# Patient Record
Sex: Female | Born: 1955 | Race: White | Hispanic: No | Marital: Married | Smoking: Never smoker
Health system: Southern US, Community
[De-identification: ages and names within clinical notes are randomized; demographics above are authoritative.]

## PROBLEM LIST (undated history)

## (undated) DIAGNOSIS — R51 Headache: Secondary | ICD-10-CM

## (undated) DIAGNOSIS — M4302 Spondylolysis, cervical region: Secondary | ICD-10-CM

## (undated) DIAGNOSIS — Z860100 Personal history of colon polyps, unspecified: Secondary | ICD-10-CM

## (undated) DIAGNOSIS — E669 Obesity, unspecified: Secondary | ICD-10-CM

## (undated) DIAGNOSIS — G8929 Other chronic pain: Secondary | ICD-10-CM

## (undated) DIAGNOSIS — C44320 Squamous cell carcinoma of skin of unspecified parts of face: Secondary | ICD-10-CM

## (undated) DIAGNOSIS — K589 Irritable bowel syndrome without diarrhea: Secondary | ICD-10-CM

## (undated) DIAGNOSIS — M5412 Radiculopathy, cervical region: Secondary | ICD-10-CM

## (undated) DIAGNOSIS — E785 Hyperlipidemia, unspecified: Secondary | ICD-10-CM

## (undated) DIAGNOSIS — R519 Headache, unspecified: Secondary | ICD-10-CM

## (undated) DIAGNOSIS — N951 Menopausal and female climacteric states: Secondary | ICD-10-CM

## (undated) DIAGNOSIS — T7840XA Allergy, unspecified, initial encounter: Secondary | ICD-10-CM

## (undated) DIAGNOSIS — K219 Gastro-esophageal reflux disease without esophagitis: Secondary | ICD-10-CM

## (undated) HISTORY — DX: Squamous cell carcinoma of skin of unspecified parts of face: C44.320

## (undated) HISTORY — PX: ABLATION: SHX5711

## (undated) HISTORY — DX: Gastro-esophageal reflux disease without esophagitis: K21.9

## (undated) HISTORY — DX: Personal history of colon polyps, unspecified: Z86.0100

## (undated) HISTORY — DX: Hyperlipidemia, unspecified: E78.5

## (undated) HISTORY — DX: Obesity, unspecified: E66.9

## (undated) HISTORY — DX: Spondylolysis, cervical region: M43.02

## (undated) HISTORY — DX: Radiculopathy, cervical region: M54.12

## (undated) HISTORY — DX: Menopausal and female climacteric states: N95.1

## (undated) HISTORY — PX: ROTATOR CUFF REPAIR: SHX139

## (undated) HISTORY — DX: Headache: R51

## (undated) HISTORY — DX: Irritable bowel syndrome, unspecified: K58.9

## (undated) HISTORY — DX: Headache, unspecified: R51.9

## (undated) HISTORY — PX: BREAST LUMPECTOMY: SHX2

## (undated) HISTORY — DX: Personal history of colonic polyps: Z86.010

## (undated) HISTORY — DX: Other chronic pain: G89.29

## (undated) HISTORY — PX: LEG SURGERY: SHX1003

## (undated) HISTORY — DX: Allergy, unspecified, initial encounter: T78.40XA

---

## 2000-03-23 ENCOUNTER — Ambulatory Visit (HOSPITAL_COMMUNITY): Admission: RE | Admit: 2000-03-23 | Discharge: 2000-03-23 | Payer: Self-pay | Admitting: Obstetrics and Gynecology

## 2000-03-23 ENCOUNTER — Encounter: Payer: Self-pay | Admitting: Obstetrics and Gynecology

## 2000-04-05 ENCOUNTER — Encounter: Payer: Self-pay | Admitting: Obstetrics and Gynecology

## 2000-04-05 ENCOUNTER — Encounter: Admission: RE | Admit: 2000-04-05 | Discharge: 2000-04-05 | Payer: Self-pay | Admitting: Obstetrics and Gynecology

## 2004-11-02 ENCOUNTER — Ambulatory Visit (HOSPITAL_BASED_OUTPATIENT_CLINIC_OR_DEPARTMENT_OTHER): Admission: RE | Admit: 2004-11-02 | Discharge: 2004-11-02 | Payer: Self-pay | Admitting: Orthopaedic Surgery

## 2006-08-28 ENCOUNTER — Encounter: Admission: RE | Admit: 2006-08-28 | Discharge: 2006-08-28 | Payer: Self-pay | Admitting: Orthopaedic Surgery

## 2006-09-14 ENCOUNTER — Encounter: Admission: RE | Admit: 2006-09-14 | Discharge: 2006-09-14 | Payer: Self-pay | Admitting: Orthopaedic Surgery

## 2011-11-05 ENCOUNTER — Emergency Department (INDEPENDENT_AMBULATORY_CARE_PROVIDER_SITE_OTHER)
Admission: EM | Admit: 2011-11-05 | Discharge: 2011-11-05 | Disposition: A | Discharge status: Home / Self Care | Payer: BC Managed Care – PPO | Source: Home / Self Care | Attending: Emergency Medicine | Admitting: Emergency Medicine

## 2011-11-05 DIAGNOSIS — R059 Cough, unspecified: Secondary | ICD-10-CM

## 2011-11-05 DIAGNOSIS — R05 Cough: Secondary | ICD-10-CM

## 2011-11-05 DIAGNOSIS — J069 Acute upper respiratory infection, unspecified: Secondary | ICD-10-CM

## 2011-11-05 MED ORDER — GUAIFENESIN-CODEINE 100-10 MG/5ML PO SYRP: 5.0000 mL | ORAL_SOLUTION | Freq: Four times a day (QID) | ORAL | Status: AC | PRN

## 2011-11-05 MED ORDER — AZITHROMYCIN 250 MG PO TABS: ORAL_TABLET | ORAL | Status: AC

## 2011-11-05 NOTE — ED Notes (Signed)
Pt c/o nasal congestion and non productive cough for past week.  Pt taking ibuprofen and Robitussin for relief.

## 2011-11-05 NOTE — ED Provider Notes (Signed)
History     CSN: [id number]  Arrival date & time 11/05/11  1152   First MD Initiated Contact with Patient 11/05/11 1152      No chief complaint on file.   (Consider location/radiation/quality/duration/timing/severity/associated sxs/prior treatment) HPI Eliana is a 56 y.o. female who complains of onset of cold symptoms for 7 days. Worsening.  Husband sick for 3 weeks. No sore throat + cough No pleuritic pain No wheezing + nasal congestion + post-nasal drainage + sinus pain/pressure + chest congestion No itchy/red eyes No earache No hemoptysis + SOB No chills/sweats No fever No nausea No vomiting No abdominal pain No diarrhea No skin rashes No fatigue No myalgias + headache    No past medical history on file.  No past surgical history on file.  No family history on file.  History  Substance Use Topics  . Smoking status: Not on file  . Smokeless tobacco: Not on file  . Alcohol Use: Not on file    OB History    No data available      Review of Systems  All other systems reviewed and are negative.    Allergies  Review of patient's allergies indicates not on file.  Home Medications  No current outpatient prescriptions on file.  There were no vitals taken for this visit.  Physical Exam  Nursing note and vitals reviewed. Constitutional: She is oriented to person, place, and time. She appears well-developed and well-nourished.  HENT:  Head: Normocephalic and atraumatic.  Right Ear: Tympanic membrane, external ear and ear canal normal.  Left Ear: Tympanic membrane, external ear and ear canal normal.  Nose: Mucosal edema and rhinorrhea present.  Mouth/Throat: Posterior oropharyngeal erythema present. No oropharyngeal exudate or posterior oropharyngeal edema.  Eyes: No scleral icterus.  Neck: Neck supple.  Cardiovascular: Regular rhythm and normal heart sounds.   Pulmonary/Chest: Effort normal and breath sounds normal. No respiratory distress.    Neurological: She is alert and oriented to person, place, and time.  Skin: Skin is warm and dry.  Psychiatric: She has a normal mood and affect. Her speech is normal.    ED Course  Procedures (including critical care time)  Labs Reviewed - No data to display No results found.   1. Acute upper respiratory infections of unspecified site   2. Cough       MDM  1)  Take the prescribed antibiotic as instructed. 2)  Use nasal saline solution (over the counter) at least 3 times a day. 3)  Use over the counter decongestants like Zyrtec-D every 12 hours as needed to help with congestion.  If you have hypertension, do not take medicines with sudafed.  4)  Can take tylenol every 6 hours or motrin every 8 hours for pain or fever. 5)  Follow up with your primary doctor if no improvement in 5-7 days, sooner if increasing pain, fever, or new symptoms.     Lily Kocher, MD 11/05/11 1224

## 2012-06-19 ENCOUNTER — Emergency Department (INDEPENDENT_AMBULATORY_CARE_PROVIDER_SITE_OTHER)
Admission: EM | Admit: 2012-06-19 | Discharge: 2012-06-19 | Disposition: A | Discharge status: Home / Self Care | Payer: BC Managed Care – PPO | Source: Home / Self Care

## 2012-06-19 ENCOUNTER — Encounter: Payer: Self-pay | Admitting: *Deleted

## 2012-06-19 DIAGNOSIS — J069 Acute upper respiratory infection, unspecified: Secondary | ICD-10-CM

## 2012-06-19 MED ORDER — HYDROCOD POLST-CHLORPHEN POLST 10-8 MG/5ML PO LQCR: 5.0000 mL | Freq: Two times a day (BID) | ORAL | Status: DC | PRN

## 2012-06-19 MED ORDER — AMOXICILLIN 875 MG PO TABS: ORAL_TABLET | ORAL | Status: DC

## 2012-06-19 MED ORDER — FLUTICASONE PROPIONATE 50 MCG/ACT NA SUSP: 2.0000 | Freq: Every day | NASAL | Status: DC

## 2012-06-19 NOTE — ED Provider Notes (Signed)
History     CSN: [id number]  Arrival date & time 06/19/12  1418   None     Chief Complaint  Patient presents with  . Nasal Congestion   HPI URI Symptoms Onset: 7-10 days  Description: rhinorrhea, nasal congestion, sinus pressure, cough Modifying factors:  Was initially seen for this 1 week ago at area [facility]. Was given zpak and steroids. Pt states that sxs improved mildly with treatment but then returned. No fevers, chills. Multiple sick contacts at work with similar sxs.   Symptoms Nasal discharge: yes Fever: no Sore throat: yes Cough: yes Wheezing: no Ear pain: yes; L  GI symptoms: no Sick contacts: yes  Red Flags  Stiff neck: no Dyspnea: no Rash: no Swallowing difficulty: no  Sinusitis Risk Factors Headache/face pain: no Double sickening: no tooth pain: no  Allergy Risk Factors Sneezing: yes Itchy scratchy throat: yes Seasonal symptoms: yes  Flu Risk Factors Headache: no muscle aches: no severe fatigue: no   History reviewed. No pertinent past medical history.  Past Surgical History  Procedure Date  . Breast lumpectomy   . Leg surgery     RT- fracture    Family History  Problem Relation Age of Onset  . Cancer Mother     melanoma  . Diabetes Father     History  Substance Use Topics  . Smoking status: Never Smoker   . Smokeless tobacco: Not on file  . Alcohol Use: No    OB History    Grav Para Term Preterm Abortions TAB SAB Ect Mult Living                  Review of Systems  All other systems reviewed and are negative.    Allergies  Review of patient's allergies indicates no known allergies.  Home Medications   Current Outpatient Rx  Name Route Sig Dispense Refill  . LORATADINE 10 MG PO TABS Oral Take 10 mg by mouth daily.    Marland Kitchen HRT SUPPORT PO Oral Take by mouth.    . GUAIFENESIN 100 MG/5ML PO SYRP Oral Take 200 mg by mouth 3 (three) times daily as needed.      BP 121/78  Pulse 63  Temp 97.9 F (36.6 C) (Oral)  Resp 18   Ht 5' 4.5" (1.638 m)  Wt 167 lb (75.751 kg)  BMI 28.22 kg/m2  SpO2 100%  Physical Exam  Constitutional: She appears well-developed and well-nourished.  HENT:  Head: Normocephalic and atraumatic.  Right Ear: External ear normal.  Left Ear: External ear normal.       +nasal erythema, rhinorrhea bilaterally, + post oropharyngeal erythema    Eyes: Conjunctivae normal are normal. Pupils are equal, round, and reactive to light.  Neck: Normal range of motion. Neck supple.  Cardiovascular: Normal rate and regular rhythm.   Pulmonary/Chest: Effort normal and breath sounds normal.  Abdominal: Soft. Bowel sounds are normal.  Musculoskeletal: Normal range of motion.  Lymphadenopathy:    She has no cervical adenopathy.  Neurological: She is alert.  Skin: Skin is warm.    ED Course  Procedures (including critical care time)  Labs Reviewed - No data to display No results found.   1. URI (upper respiratory infection)       MDM  Suspect this is likely overlapping and viral etiology of sxs.  No current resp/pulmonary red flags.  Continue claritin.  Flonase for allergic component.  Tussionex for cough.  Pt s/p zpak last week. Should be in system  for additional 10-14 days s/p abx course.  Pt given rx for amox (fill date 10/1) if sxs fail to improve over next 7-10 days.      The patient and/or caregiver has been counseled thoroughly with regard to treatment plan and/or medications prescribed including dosage, schedule, interactions, rationale for use, and possible side effects and they verbalize understanding. Diagnoses and expected course of recovery discussed and will return if not improved as expected or if the condition worsens. Patient and/or caregiver verbalized understanding.             Doree Albee, MD 06/19/12 [phone]

## 2012-06-19 NOTE — ED Notes (Signed)
Pt co cough, nasal congestion, HA, sinus pain/pressure and runny nose x 1wk. Pt reports that she was seen at an urgent care last wk in [place] was given Prednisone and an ABT. She has taken Claritin.

## 2013-06-01 ENCOUNTER — Encounter: Payer: Self-pay | Admitting: Emergency Medicine

## 2013-06-01 ENCOUNTER — Emergency Department (INDEPENDENT_AMBULATORY_CARE_PROVIDER_SITE_OTHER)
Admission: EM | Admit: 2013-06-01 | Discharge: 2013-06-01 | Disposition: A | Discharge status: Home / Self Care | Payer: BC Managed Care – PPO | Source: Home / Self Care | Attending: Family Medicine | Admitting: Family Medicine

## 2013-06-01 ENCOUNTER — Emergency Department (INDEPENDENT_AMBULATORY_CARE_PROVIDER_SITE_OTHER): Payer: BC Managed Care – PPO

## 2013-06-01 DIAGNOSIS — M722 Plantar fascial fibromatosis: Secondary | ICD-10-CM

## 2013-06-01 DIAGNOSIS — M25579 Pain in unspecified ankle and joints of unspecified foot: Secondary | ICD-10-CM

## 2013-06-01 DIAGNOSIS — G5752 Tarsal tunnel syndrome, left lower limb: Secondary | ICD-10-CM

## 2013-06-01 DIAGNOSIS — M79609 Pain in unspecified limb: Secondary | ICD-10-CM

## 2013-06-01 DIAGNOSIS — M25572 Pain in left ankle and joints of left foot: Secondary | ICD-10-CM

## 2013-06-01 MED ORDER — MELOXICAM 15 MG PO TABS: 15.0000 mg | ORAL_TABLET | Freq: Every day | ORAL | Status: DC

## 2013-06-01 NOTE — ED Provider Notes (Signed)
CSN: [id number]     Arrival date & time 06/01/13  1130 History   First MD Initiated Contact with Patient 06/01/13 1139     Chief Complaint  Patient presents with  . Ankle Pain    HPI  Left ankle and foot pain x1 week. Patient's issues a history of chronic bilateral foot pain. Patient states feet all day at work. Patient states she's noticed some left ankle pain of the past week. Has had some mild burning in tingling with it as well. Pain is predominantly in the left medial ankle. Has also had some plantar pain as well. Pain seems to be fairly constant and seems to be exacerbated with prolonged episodes of standing. No true numbness. Patient has history of ankle surgery in the past however this is in the remote past.  History reviewed. No pertinent past medical history. Past Surgical History  Procedure Laterality Date  . Breast lumpectomy    . Leg surgery      RT- fracture   Family History  Problem Relation Age of Onset  . Cancer Mother     melanoma  . Diabetes Father    History  Substance Use Topics  . Smoking status: Never Smoker   . Smokeless tobacco: Not on file  . Alcohol Use: No   OB History   Grav Para Term Preterm Abortions TAB SAB Ect Mult Living                 Review of Systems  All other systems reviewed and are negative.    Allergies  Review of patient's allergies indicates no known allergies.  Home Medications   Current Outpatient Rx  Name  Route  Sig  Dispense  Refill  . amoxicillin (AMOXIL) 875 MG tablet      1 tab po bid x 10 days. Use only if sxs not improved by 06/26/12   20 tablet   0   . chlorpheniramine-HYDROcodone (TUSSIONEX PENNKINETIC ER) 10-8 MG/5ML LQCR   Oral   Take 5 mLs by mouth every 12 (twelve) hours as needed (cough).   60 mL   0   . fluticasone (FLONASE) 50 MCG/ACT nasal spray   Nasal   Place 2 sprays into the nose daily.   16 g   12   . guaifenesin (ROBITUSSIN CHEST CONGESTION) 100 MG/5ML syrup   Oral   Take  200 mg by mouth 3 (three) times daily as needed.         . loratadine (CLARITIN) 10 MG tablet   Oral   Take 10 mg by mouth daily.         . Nutritional Supplements (HRT SUPPORT PO)   Oral   Take by mouth.          BP 126/76  Pulse 54  Temp(Src) 98 F (36.7 C) (Oral)  Resp 16  Ht 5' 4.5" (1.638 m)  Wt 160 lb (72.576 kg)  BMI 27.05 kg/m2  SpO2 100% Physical Exam  Constitutional: She appears well-developed and well-nourished.  HENT:  Head: Normocephalic and atraumatic.  Eyes: Conjunctivae are normal. Pupils are equal, round, and reactive to light.  Neck: Normal range of motion.  Cardiovascular: Normal rate, regular rhythm and normal heart sounds.   Pulmonary/Chest: Effort normal.  Abdominal: Soft.  Musculoskeletal:       Feet:  Positive tenderness palpation along left medial ankle. Over distribution of posterior tibial nerve. Mild swelling. Mild plantar fascial pain with palpation and dorsiflexion of toes. Neurovascularly intact distally  Neurological: She is alert.  Skin: Skin is warm.    ED Course  Procedures (including critical care time) Labs Review Labs Reviewed - No data to display Imaging Review Dg Ankle Complete Left  06/01/2013   *RADIOLOGY REPORT*  Clinical Data: Pain.  No injury.  LEFT ANKLE COMPLETE - 3+ VIEW  Comparison: None.  Findings: Examination demonstrates no evidence of fracture or dislocation.  There is  K-wire fixation over the distal first metatarsal.  IMPRESSION: No acute findings.   Original Report Authenticated By: Elberta Fortis, M.D.   Dg Foot Complete Left  06/01/2013   *RADIOLOGY REPORT*  Clinical Data: Left foot pain  LEFT FOOT - COMPLETE 3+ VIEW  Comparison: Concurrently obtained radiographs of the left ankle  Findings: Three radiographs the left foot demonstrate prior surgical changes of hallux valgus repair.  18 is noted within the healed osteotomy site.  No evidence of hardware complication.  No acute fracture, or malalignment.  Mild  degenerative changes noted at the great toe MTP joint.  No focal soft tissue swelling.  Normal bony mineralization.  IMPRESSION:  Surgical changes of healed first metatarsal osteotomy for hallux valgus repair without evidence of complication.  No acute osseous abnormality.  Mild degenerative change noted at the great toe MTP joint.   Original Report Authenticated By: Malachy Moan, M.D.    MDM   1. Pain in joint, ankle and foot, left   2. Tarsal tunnel syndrome of left side   3. Plantar fasciitis of left foot    Symptoms seem most consistent with acute tarsal tunnel syndrome with some overlap of plantar fasciitis. Will place a postop shoe with heel pad. Mobic for inflammatory component. Discussed general care. Rice. Plan followup sports medicine next 1-2 weeks for general reevaluation of symptoms. No noted fracture dislocation on imaging. Discussed general musculoskeletal right flags. Handout on tarsal tunnel and plantar fasciitis given. Followup as needed.    The patient and/or caregiver has been counseled thoroughly with regard to treatment plan and/or medications prescribed including dosage, schedule, interactions, rationale for use, and possible side effects and they verbalize understanding. Diagnoses and expected course of recovery discussed and will return if not improved as expected or if the condition worsens. Patient and/or caregiver verbalized understanding.         Doree Albee, MD 06/01/13 1233

## 2013-06-01 NOTE — ED Notes (Signed)
Patient c/o foot and ankle pain x 1 wk. Patient states it has been swelling in the evenings denies injury.

## 2013-06-13 ENCOUNTER — Ambulatory Visit (INDEPENDENT_AMBULATORY_CARE_PROVIDER_SITE_OTHER): Payer: BC Managed Care – PPO | Admitting: Sports Medicine

## 2013-06-13 ENCOUNTER — Ambulatory Visit (INDEPENDENT_AMBULATORY_CARE_PROVIDER_SITE_OTHER): Payer: BC Managed Care – PPO

## 2013-06-13 ENCOUNTER — Encounter: Payer: Self-pay | Admitting: Sports Medicine

## 2013-06-13 VITALS — BP 129/67 | HR 57 | Wt 171.0 lb

## 2013-06-13 DIAGNOSIS — M79671 Pain in right foot: Secondary | ICD-10-CM | POA: Insufficient documentation

## 2013-06-13 DIAGNOSIS — M79672 Pain in left foot: Secondary | ICD-10-CM | POA: Insufficient documentation

## 2013-06-13 DIAGNOSIS — M79609 Pain in unspecified limb: Secondary | ICD-10-CM

## 2013-06-13 DIAGNOSIS — M47817 Spondylosis without myelopathy or radiculopathy, lumbosacral region: Secondary | ICD-10-CM

## 2013-06-13 MED ORDER — TRAMADOL HCL 50 MG PO TABS: ORAL_TABLET | ORAL | Status: DC

## 2013-06-13 MED ORDER — PREDNISONE 50 MG PO TABS: ORAL_TABLET | ORAL | Status: DC

## 2013-06-13 NOTE — Progress Notes (Signed)
   Subjective:    I'm seeing this patient as a consultation for:  Dr. Orson Aloe  CC: Bilateral foot pain  HPI: This is a pleasant 57 year old female who comes in with a one off history of bilateral foot pain associated with numbness is present after a long day of working on her feet. She is required to wear steel toed very rigid boots, she spends approximately 11 hours on her feet per day. Initially she has pain that she localizes behind the malleolus as well as on the plantar aspect of both feet, which then turns into swelling and subsequently numbness. She denies any pain in her back but does note occasionally she has some numbness radiating down her legs. Symptoms are moderate, persistent, she's tried some oral anti-inflammatories which have only been minimally effective.  Past medical history, Surgical history, Family history not pertinant except as noted below, Social history, Allergies, and medications have been entered into the medical record, reviewed, and no changes needed.   Review of Systems: No headache, visual changes, nausea, vomiting, diarrhea, constipation, dizziness, abdominal pain, skin rash, fevers, chills, night sweats, weight loss, swollen lymph nodes, body aches, joint swelling, muscle aches, chest pain, shortness of breath, mood changes, visual or auditory hallucinations.   Objective:   General: Well Developed, well nourished, and in no acute distress.  Neuro/Psych: Alert and oriented x3, extra-ocular muscles intact, able to move all 4 extremities, sensation grossly intact. Skin: Warm and dry, no rashes noted.  Respiratory: Not using accessory muscles, speaking in full sentences, trachea midline.  Cardiovascular: Pulses palpable, no extremity edema. Abdomen: Does not appear distended. Back Exam:  Inspection: Unremarkable  Motion: Flexion 45 deg, Extension 45 deg, Side Bending to 45 deg bilaterally,  Rotation to 45 deg bilaterally  SLR laying: Reproduces radicular  symptoms down to the foot bilaterally.  XSLR laying: Negative  Palpable tenderness: None. FABER: negative. Sensory change: Gross sensation intact to all lumbar and sacral dermatomes.  Reflexes: 2+ at both patellar tendons, 2+ at achilles tendons, Babinski's downgoing.  Strength at foot  Plantar-flexion: 5/5 Dorsi-flexion: 5/5 Eversion: 5/5 Inversion: 5/5  Leg strength  Quad: 5/5 Hamstring: 5/5 Hip flexor: 5/5 Hip abductors: 5/5  Gait unremarkable. Bilateral feet: No visible erythema or swelling. Range of motion is full in all directions. Strength is 5/5 in all directions. No hallux valgus. No pes cavus or pes planus. No abnormal callus noted. No pain over the navicular prominence, or base of fifth metatarsal. No tenderness to palpation of the calcaneal insertion of plantar fascia. No pain at the Achilles insertion. No pain over the calcaneal bursa. No pain of the retrocalcaneal bursa. No tenderness to palpation over the tarsals, metatarsals, or phalanges. No hallux rigidus or limitus. No tenderness palpation over interphalangeal joints. No pain with compression of the metatarsal heads. Neurovascularly intact distally.  X-rays look pretty good with the exception of anterior spurring at multiple levels, as well as mild loss of disc space at the L5-S1 level.  Impression and Recommendations:   This case required medical decision making of moderate complexity.

## 2013-06-13 NOTE — Assessment & Plan Note (Signed)
Pain, numbness, swelling at the end of the day, I think this is most likely related to soft tissue contusion from prolonged time on her feet. I would like to build her some custom orthotics, she'll come back in my next slot. I also need to workup her low back as a cause of bilateral foot numbness, x-rays, prednisone. Tramadol as needed. I will give her some restrictions at work, maximum 8 hours on her feet. To see her back for custom orthotics, and then in approximately 3-4 weeks.

## 2013-06-14 ENCOUNTER — Encounter: Payer: Self-pay | Admitting: Sports Medicine

## 2013-06-14 ENCOUNTER — Ambulatory Visit (INDEPENDENT_AMBULATORY_CARE_PROVIDER_SITE_OTHER): Payer: BC Managed Care – PPO | Admitting: Sports Medicine

## 2013-06-14 VITALS — BP 124/72 | HR 77 | Wt 171.0 lb

## 2013-06-14 DIAGNOSIS — M79672 Pain in left foot: Secondary | ICD-10-CM

## 2013-06-14 DIAGNOSIS — M79609 Pain in unspecified limb: Secondary | ICD-10-CM

## 2013-06-14 DIAGNOSIS — M79671 Pain in right foot: Secondary | ICD-10-CM

## 2013-06-14 NOTE — Assessment & Plan Note (Signed)
Greatly improved with tramadol and prednisone. 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 Custom orthotics as above. Return in approximately 3 weeks to see how things are going.

## 2013-06-14 NOTE — Progress Notes (Signed)
    Patient was fitted for a : standard, cushioned, semi-rigid orthotic. The orthotic was heated and afterward the patient stood on the orthotic blank positioned on the orthotic stand. The patient was positioned in subtalar neutral position and 10 degrees of ankle dorsiflexion in a weight bearing stance. After completion of molding, a stable base was applied to the orthotic blank. The blank was ground to a stable position for weight bearing. Size: 8 Base: Blue EVA Additional Posting and Padding: None The patient ambulated these, and they were very comfortable.  I spent 40 minutes with this patient, greater than 50% was face-to-face time counseling regarding the below diagnosis.   

## 2013-06-25 ENCOUNTER — Ambulatory Visit (INDEPENDENT_AMBULATORY_CARE_PROVIDER_SITE_OTHER): Payer: BC Managed Care – PPO | Admitting: Sports Medicine

## 2013-06-25 ENCOUNTER — Encounter: Payer: Self-pay | Admitting: Sports Medicine

## 2013-06-25 VITALS — BP 112/63 | HR 68 | Wt 170.0 lb

## 2013-06-25 DIAGNOSIS — M76829 Posterior tibial tendinitis, unspecified leg: Secondary | ICD-10-CM

## 2013-06-25 DIAGNOSIS — M79672 Pain in left foot: Secondary | ICD-10-CM

## 2013-06-25 DIAGNOSIS — M79609 Pain in unspecified limb: Secondary | ICD-10-CM

## 2013-06-25 DIAGNOSIS — M79671 Pain in right foot: Secondary | ICD-10-CM

## 2013-06-25 NOTE — Progress Notes (Signed)
  Subjective:    CC: Followup  HPI: Bilateral foot pain: Left worse than right. I placed her in custom orthotics at the last visit, unfortunately she continues to have pain but predominately complains about the orthotic in that the tip of her toes feel like they reach over the end of the orthotic. Otherwise her predominant pain is now on the left foot behind the medial malleolus down to the navicular. Pain is moderate, persistent, worse. No trauma.  Past medical history, Surgical history, Family history not pertinant except as noted below, Social history, Allergies, and medications have been entered into the medical record, reviewed, and no changes needed.   Review of Systems: No fevers, chills, night sweats, weight loss, chest pain, or shortness of breath.   Objective:    General: Well Developed, well nourished, and in no acute distress.  Neuro: Alert and oriented x3, extra-ocular muscles intact, sensation grossly intact.  HEENT: Normocephalic, atraumatic, pupils equal round reactive to light, neck supple, no masses, no lymphadenopathy, thyroid nonpalpable.  Skin: Warm and dry, no rashes. Cardiac: Regular rate and rhythm, no murmurs rubs or gallops, no lower extremity edema.  Respiratory: Clear to auscultation bilaterally. Not using accessory muscles, speaking in full sentences. Left Ankle: Visible swelling of the tibialis posterior tendon just distal to the medial malleolus. Range of motion is full in all directions. Strength is 5/5 in all directions. Stable lateral and medial ligaments; squeeze test and kleiger test unremarkable; Talar dome nontender; No pain at base of 5th MT; No tenderness over cuboid; No tenderness over N spot or navicular prominence 10 palpation of the tibialis posterior with reproduction of pain with resisted ankle inversion. No sign of peroneal tendon subluxations or tenderness to palpation Negative tarsal tunnel tinel's Able to walk 4 steps.  Procedure:  Real-time Ultrasound Guided Injection of left tibialis posterior tendon sheath Device: GE Logiq E  Verbal informed consent obtained.  Patient was informed about the risk of tendon rupture. Time-out conducted.  Noted no overlying erythema, induration, or other signs of local infection.  Skin prepped in a sterile fashion.  Local anesthesia: Topical Ethyl chloride.  With sterile technique and under real time ultrasound guidance:  Tibialis posterior seen to be surrounded by fluid in the tendon sheath. 25-gauge needle advanced into the fluid, 1 cc Kenalog 40, 3 cc lidocaine injected easily. Completed without difficulty  Pain immediately resolved suggesting accurate placement of the medication.  Advised to call if fevers/chills, erythema, induration, drainage, or persistent bleeding.  Images permanently stored and available for review in the ultrasound unit.  Impression: Technically successful ultrasound guided injection.  The foot was then strapped with compressive dressing.  Impression and Recommendations:

## 2013-06-25 NOTE — Assessment & Plan Note (Signed)
Symptoms today predominately represents a left-sided tibialis posterior tendinitis. Ultrasound guided injection as above. Cast boot for 2 weeks. Return to see me in one month.

## 2013-07-05 ENCOUNTER — Ambulatory Visit: Payer: BC Managed Care – PPO | Admitting: Sports Medicine

## 2013-07-11 ENCOUNTER — Encounter: Payer: Self-pay | Admitting: Sports Medicine

## 2013-07-11 ENCOUNTER — Ambulatory Visit (INDEPENDENT_AMBULATORY_CARE_PROVIDER_SITE_OTHER): Payer: BC Managed Care – PPO | Admitting: Sports Medicine

## 2013-07-11 ENCOUNTER — Telehealth: Payer: Self-pay | Admitting: *Deleted

## 2013-07-11 VITALS — BP 134/80 | HR 60 | Wt 170.0 lb

## 2013-07-11 DIAGNOSIS — M79672 Pain in left foot: Secondary | ICD-10-CM

## 2013-07-11 DIAGNOSIS — M79609 Pain in unspecified limb: Secondary | ICD-10-CM

## 2013-07-11 DIAGNOSIS — M79671 Pain in right foot: Secondary | ICD-10-CM

## 2013-07-11 MED ORDER — HYDROCODONE-ACETAMINOPHEN 5-325 MG PO TABS: 1.0000 | ORAL_TABLET | Freq: Three times a day (TID) | ORAL | Status: DC | PRN

## 2013-07-11 NOTE — Progress Notes (Signed)
  Subjective:    CC: Follow up  HPI: Foot pain: At the last visit symptoms were mostly referrable to the tibialis posterior tendon of the left foot. I performed a guided injection, and placed her in a cast boot. She returns today she had an excellent response, but this week she has had some return of her pain. It is mild, does not bother her very much. She hasn't been doing any rehabilitation exercises yet.  Past medical history, Surgical history, Family history not pertinant except as noted below, Social history, Allergies, and medications have been entered into the medical record, reviewed, and no changes needed.   Review of Systems: No fevers, chills, night sweats, weight loss, chest pain, or shortness of breath.   Objective:    General: Well Developed, well nourished, and in no acute distress.  Neuro: Alert and oriented x3, extra-ocular muscles intact, sensation grossly intact.  HEENT: Normocephalic, atraumatic, pupils equal round reactive to light, neck supple, no masses, no lymphadenopathy, thyroid nonpalpable.  Skin: Warm and dry, no rashes. Cardiac: Regular rate and rhythm, no murmurs rubs or gallops, no lower extremity edema.  Respiratory: Clear to auscultation bilaterally. Not using accessory muscles, speaking in full sentences. Left Ankle: No visible erythema or swelling. Range of motion is full in all directions. Strength is 5/5 in all directions. Stable lateral and medial ligaments; squeeze test and kleiger test unremarkable; Talar dome nontender; No pain at base of 5th MT; No tenderness over cuboid; No tenderness over N spot or navicular prominence No tenderness on posterior aspects of lateral and medial malleolus, there is only mild tenderness to palpation over the tibial nerve. No sign of peroneal tendon subluxations or tenderness to palpation Negative tarsal tunnel tinel's Able to walk 4 steps. Left Foot: No visible erythema or swelling. Range of motion is full in  all directions. Strength is 5/5 in all directions. No hallux valgus. No pes cavus or pes planus. No abnormal callus noted. No pain over the navicular prominence, or base of fifth metatarsal. No tenderness to palpation of the calcaneal insertion of plantar fascia. No pain at the Achilles insertion. No pain over the calcaneal bursa. No pain of the retrocalcaneal bursa. No tenderness to palpation over the tarsals, metatarsals, or phalanges. No hallux rigidus or limitus. No tenderness palpation over interphalangeal joints. No pain with compression of the metatarsal heads. Neurovascularly intact distally.  Impression and Recommendations:

## 2013-07-11 NOTE — Assessment & Plan Note (Signed)
At the last visit pain was predominantly in the left ankle at the tibialis posterior, I injected the tendon sheath under guidance. She did much better, last week, but today is having some pain, more than last week. At this point I am going to take her out of the cast boot, ankle looks a lot better. Now like her to rehabilitation the tibialis posterior. Short course of hydrocodone. MRI of the ankle. The foot was strapped with compressive dressing.

## 2013-07-11 NOTE — Telephone Encounter (Signed)
No PA required for the MRI foot/ankle Left w/o contrast.  Meyer Cory, LPN

## 2013-07-11 NOTE — Patient Instructions (Signed)
Due the tibialis posterior rehabilitation consisting of pushing around a can of paint 3 sets of 30 on each side.

## 2013-07-13 ENCOUNTER — Encounter (INDEPENDENT_AMBULATORY_CARE_PROVIDER_SITE_OTHER): Payer: Self-pay

## 2013-07-13 ENCOUNTER — Ambulatory Visit (HOSPITAL_BASED_OUTPATIENT_CLINIC_OR_DEPARTMENT_OTHER)
Admission: RE | Admit: 2013-07-13 | Discharge: 2013-07-13 | Disposition: A | Discharge status: Home / Self Care | Payer: BC Managed Care – PPO | Source: Ambulatory Visit | Attending: Sports Medicine | Admitting: Sports Medicine

## 2013-07-13 DIAGNOSIS — M25579 Pain in unspecified ankle and joints of unspecified foot: Secondary | ICD-10-CM | POA: Insufficient documentation

## 2013-07-13 DIAGNOSIS — M719 Bursopathy, unspecified: Secondary | ICD-10-CM | POA: Insufficient documentation

## 2013-07-13 DIAGNOSIS — M6790 Unspecified disorder of synovium and tendon, unspecified site: Secondary | ICD-10-CM | POA: Insufficient documentation

## 2013-07-13 DIAGNOSIS — M25476 Effusion, unspecified foot: Secondary | ICD-10-CM | POA: Insufficient documentation

## 2013-07-13 DIAGNOSIS — M25473 Effusion, unspecified ankle: Secondary | ICD-10-CM | POA: Insufficient documentation

## 2013-07-16 ENCOUNTER — Ambulatory Visit: Payer: BC Managed Care – PPO | Admitting: Sports Medicine

## 2013-07-18 ENCOUNTER — Ambulatory Visit (INDEPENDENT_AMBULATORY_CARE_PROVIDER_SITE_OTHER): Payer: BC Managed Care – PPO | Admitting: Sports Medicine

## 2013-07-18 ENCOUNTER — Encounter: Payer: Self-pay | Admitting: Sports Medicine

## 2013-07-18 VITALS — BP 127/81 | HR 66 | Wt 168.0 lb

## 2013-07-18 DIAGNOSIS — M79672 Pain in left foot: Secondary | ICD-10-CM

## 2013-07-18 DIAGNOSIS — M79609 Pain in unspecified limb: Secondary | ICD-10-CM

## 2013-07-18 DIAGNOSIS — M79671 Pain in right foot: Secondary | ICD-10-CM

## 2013-07-18 NOTE — Assessment & Plan Note (Signed)
New custom orthotics as above. Pain is improved after tibialis posterior injection, MRI did show tibialis posterior tendinitis. Return in one month.

## 2013-07-18 NOTE — Progress Notes (Signed)
    Patient was fitted for a : standard, cushioned, semi-rigid orthotic. The orthotic was heated and afterward the patient stood on the orthotic blank positioned on the orthotic stand. The patient was positioned in subtalar neutral position and 10 degrees of ankle dorsiflexion in a weight bearing stance. After completion of molding, a stable base was applied to the orthotic blank. The blank was ground to a stable position for weight bearing. Size:10 Base: Blue EVA Additional Posting and Padding: None The patient ambulated these, and they were very comfortable.  I spent 40 minutes with this patient, greater than 50% was face-to-face time counseling regarding the below diagnosis.   

## 2013-07-23 ENCOUNTER — Ambulatory Visit: Payer: BC Managed Care – PPO | Admitting: Sports Medicine

## 2013-08-15 ENCOUNTER — Ambulatory Visit (INDEPENDENT_AMBULATORY_CARE_PROVIDER_SITE_OTHER): Payer: BC Managed Care – PPO | Admitting: Sports Medicine

## 2013-08-15 ENCOUNTER — Encounter: Payer: Self-pay | Admitting: Sports Medicine

## 2013-08-15 VITALS — BP 110/62 | HR 71 | Wt 170.0 lb

## 2013-08-15 DIAGNOSIS — M79671 Pain in right foot: Secondary | ICD-10-CM

## 2013-08-15 DIAGNOSIS — M79609 Pain in unspecified limb: Secondary | ICD-10-CM

## 2013-08-15 DIAGNOSIS — M79672 Pain in left foot: Secondary | ICD-10-CM

## 2013-08-15 NOTE — Progress Notes (Signed)
  Subjective:    CC: Followup  HPI: Bilateral foot pain: So far is status post tibials posterior injection, physical therapy, custom orthotics, right side is pain-free, unfortunately she still has some pain on the left side in the arch, as well as at the third metatarsal head. Symptoms are worse with standing, moderate, persistent. She does get occasional numbness and tingling into her toes on the left side.  Past medical history, Surgical history, Family history not pertinant except as noted below, Social history, Allergies, and medications have been entered into the medical record, reviewed, and no changes needed.   Review of Systems: No fevers, chills, night sweats, weight loss, chest pain, or shortness of breath.   Objective:    General: Well Developed, well nourished, and in no acute distress.  Neuro: Alert and oriented x3, extra-ocular muscles intact, sensation grossly intact.  HEENT: Normocephalic, atraumatic, pupils equal round reactive to light, neck supple, no masses, no lymphadenopathy, thyroid nonpalpable.  Skin: Warm and dry, no rashes. Cardiac: Regular rate and rhythm, no murmurs rubs or gallops, no lower extremity edema.  Respiratory: Clear to auscultation bilaterally. Not using accessory muscles, speaking in full sentences. Left Foot: No visible erythema or swelling. Range of motion is full in all directions. Strength is 5/5 in all directions. No hallux valgus. No pes cavus or pes planus. No abnormal callus noted. No pain over the navicular prominence, or base of fifth metatarsal. No tenderness to palpation of the calcaneal insertion of plantar fascia. No pain at the Achilles insertion. No pain over the calcaneal bursa. No pain of the retrocalcaneal bursa. Tender to palpation at the third metatarsal head, there is abnormal callus. No hallux rigidus or limitus. No tenderness palpation over interphalangeal joints. No pain with compression of the metatarsal  heads. Neurovascularly intact distally.  Impression and Recommendations:

## 2013-08-15 NOTE — Assessment & Plan Note (Signed)
Doing well with orthotics. Some left metatarsalgia. MT pad placed in the left side. Return in 2 months to see how things are going.

## 2013-10-15 ENCOUNTER — Ambulatory Visit: Payer: BC Managed Care – PPO | Admitting: Family Medicine

## 2014-07-27 ENCOUNTER — Encounter: Payer: Self-pay | Admitting: *Deleted

## 2014-07-27 ENCOUNTER — Emergency Department (INDEPENDENT_AMBULATORY_CARE_PROVIDER_SITE_OTHER)
Admission: EM | Admit: 2014-07-27 | Discharge: 2014-07-27 | Disposition: A | Discharge status: Home / Self Care | Payer: BC Managed Care – PPO | Source: Home / Self Care | Attending: Emergency Medicine | Admitting: Emergency Medicine

## 2014-07-27 DIAGNOSIS — J209 Acute bronchitis, unspecified: Secondary | ICD-10-CM

## 2014-07-27 MED ORDER — CEFTRIAXONE SODIUM 1 G IJ SOLR
1.0000 g | INTRAMUSCULAR | Status: AC
  Administered 2014-07-27: 1 g via INTRAMUSCULAR

## 2014-07-27 NOTE — ED Provider Notes (Signed)
CSN: [id number]     Arrival date & time 07/27/14  1120 History   First MD Initiated Contact with Patient 07/27/14 1123     Chief Complaint  Patient presents with  . Cough   (Consider location/radiation/quality/duration/timing/severity/associated sxs/prior Treatment) HPI URI HISTORY  Desiree Rosario is a 58 y.o. female who complains of cough for 6 days.  States cough is non productive but chest is becoming painful from coughing 6/10 pain. Pt did 2 albuterol treatments last night but it was not helpful.  + chills/sweats +  Fever  +  Nasal congestion +  Discolored Post-nasal drainage No sinus pain/pressure No sore throat  +  cough + wheezing + chest congestion No hemoptysis No definite shortness of breath,except feel short of breath when she has a coughing fit + pleuritic pain. No exertion of chest pain  No itchy/red eyes No earache  No nausea No vomiting No abdominal pain No diarrhea  No skin rashes +  Fatigue No myalgias + mild, nonfocal headache    History reviewed. No pertinent past medical history. Past Surgical History  Procedure Laterality Date  . Breast lumpectomy    . Leg surgery      RT- fracture   Family History  Problem Relation Age of Onset  . Cancer Mother     melanoma  . Diabetes Father    History  Substance Use Topics  . Smoking status: Never Smoker   . Smokeless tobacco: Not on file  . Alcohol Use: No   OB History    No data available     Review of Systems  All other systems reviewed and are negative.   Allergies  Review of patient's allergies indicates no known allergies.  Home Medications   Prior to Admission medications   Medication Sig Start Date End Date Taking? Authorizing Provider  fluticasone (FLONASE) 50 MCG/ACT nasal spray Place 2 sprays into the nose daily. 06/19/12   Shanda Howells, MD  HYDROcodone-acetaminophen (NORCO/VICODIN) 5-325 MG per tablet Take 1 tablet by mouth every 8 (eight) hours as needed for pain. 07/11/13    Silverio Decamp, MD  loratadine (CLARITIN) 10 MG tablet Take 10 mg by mouth daily.    Historical Provider, MD  meloxicam (MOBIC) 15 MG tablet Take 1 tablet (15 mg total) by mouth daily. 06/01/13   Shanda Howells, MD  Nutritional Supplements (HRT SUPPORT PO) Take by mouth.    Historical Provider, MD  traMADol (ULTRAM) 50 MG tablet 1-2 tabs by mouth Q8 hours, maximum 6 tabs per day. 06/13/13   Silverio Decamp, MD   BP 149/77 mmHg  Pulse 61  Temp(Src) 98 F (36.7 C) (Oral)  Ht 5\' 4"  (1.626 m)  Wt 160 lb (72.576 kg)  BMI 27.45 kg/m2  SpO2 100% Physical Exam  Constitutional: She is oriented to person, place, and time. She appears well-developed and well-nourished. No distress.  HENT:  Head: Normocephalic and atraumatic.  Right Ear: Tympanic membrane normal.  Left Ear: Tympanic membrane normal.  Nose: Nose normal.  Mouth/Throat: Oropharynx is clear and moist. No oropharyngeal exudate.  Eyes: Right eye exhibits no discharge. Left eye exhibits no discharge. No scleral icterus.  Neck: Neck supple.  Cardiovascular: Normal rate, regular rhythm and normal heart sounds.   Pulmonary/Chest: No accessory muscle usage. No respiratory distress. She has no decreased breath sounds. She has wheezes (mildly expiratory). She has rhonchi. She has no rales.  Occasional hacking cough noted. Breath sounds equal bilaterally  Lymphadenopathy:    She has no cervical  adenopathy.  Neurological: She is alert and oriented to person, place, and time.  Skin: Skin is warm and dry.  Nursing note and vitals reviewed.   ED Course  Procedures (including critical care time) Labs Review Labs Reviewed - No data to display  Imaging Review No results found.   MDM   1. Acute bronchitis, unspecified organism     She declined chest x-ray or DuoNeb nebulizer treatment. Treatment options discussed, as well as risks, benefits, alternatives. Patient voiced understanding and agreement with the following  plans: Rocephin 1 g IM stat Z-Pak Prednisone 20 mg twice a day 5 days. Phenergan with codeine. 120 mL's. One or 2 teaspoons every 4-6 hours when necessary severe cough. She states she has albuterol nebulizer at home from prior bronchitis flareup, she'll use that every 4-6 hours if needed for wheezing. Wrote a note excusing from work through 11/4, may return to work 07/31/14. Follow-up with your primary care doctor in 3 days if not improving, or sooner if symptoms become worse. Precautions discussed. Red flags discussed. Questions invited and answered. Patient voiced understanding and agreement.     Jacqulyn Cane, MD 07/29/14 [phone]

## 2014-07-27 NOTE — ED Notes (Signed)
Pt complains of cough for 6 days.  States cough is non productive abut chest is becoming painful from coughing 6/10 pain.  Pt did 2 albuterol treatments last night but it was not helpful.

## 2014-08-16 ENCOUNTER — Emergency Department (INDEPENDENT_AMBULATORY_CARE_PROVIDER_SITE_OTHER): Payer: BC Managed Care – PPO

## 2014-08-16 ENCOUNTER — Emergency Department (INDEPENDENT_AMBULATORY_CARE_PROVIDER_SITE_OTHER)
Admission: EM | Admit: 2014-08-16 | Discharge: 2014-08-16 | Disposition: A | Discharge status: Home / Self Care | Payer: BC Managed Care – PPO | Source: Home / Self Care | Attending: Family Medicine | Admitting: Family Medicine

## 2014-08-16 DIAGNOSIS — R05 Cough: Secondary | ICD-10-CM

## 2014-08-16 DIAGNOSIS — R059 Cough, unspecified: Secondary | ICD-10-CM

## 2014-08-16 DIAGNOSIS — R0982 Postnasal drip: Secondary | ICD-10-CM

## 2014-08-16 MED ORDER — GUAIFENESIN-CODEINE 100-10 MG/5ML PO SOLN: 5.0000 mL | Freq: Every evening | ORAL | Status: DC | PRN

## 2014-08-16 MED ORDER — PREDNISONE 5 MG PO KIT: PACK | ORAL | Status: DC

## 2014-08-16 MED ORDER — IPRATROPIUM BROMIDE 0.06 % NA SOLN: 2.0000 | Freq: Four times a day (QID) | NASAL | Status: DC

## 2014-08-16 NOTE — Discharge Instructions (Signed)
Thank you for coming in today. Take prednisone as directed for 12 days. Use Atrovent nasal spray for postnasal drip. Use codeine containing cough medication as needed.  Call or go to the emergency room if you get worse, have trouble breathing, have chest pains, or palpitations.    Cough, Adult  A cough is a reflex that helps clear your throat and airways. It can help heal the body or may be a reaction to an irritated airway. A cough may only last 2 or 3 weeks (acute) or may last more than 8 weeks (chronic).  CAUSES Acute cough:  Viral or bacterial infections. Chronic cough:  Infections.  Allergies.  Asthma.  Post-nasal drip.  Smoking.  Heartburn or acid reflux.  Some medicines.  Chronic lung problems (COPD).  Cancer. SYMPTOMS   Cough.  Fever.  Chest pain.  Increased breathing rate.  High-pitched whistling sound when breathing (wheezing).  Colored mucus that you cough up (sputum). TREATMENT   A bacterial cough may be treated with antibiotic medicine.  A viral cough must run its course and will not respond to antibiotics.  Your caregiver may recommend other treatments if you have a chronic cough. HOME CARE INSTRUCTIONS   Only take over-the-counter or prescription medicines for pain, discomfort, or fever as directed by your caregiver. Use cough suppressants only as directed by your caregiver.  Use a cold steam vaporizer or humidifier in your bedroom or home to help loosen secretions.  Sleep in a semi-upright position if your cough is worse at night.  Rest as needed.  Stop smoking if you smoke. SEEK IMMEDIATE MEDICAL CARE IF:   You have pus in your sputum.  Your cough starts to worsen.  You cannot control your cough with suppressants and are losing sleep.  You begin coughing up blood.  You have difficulty breathing.  You develop pain which is getting worse or is uncontrolled with medicine.  You have a fever. MAKE SURE YOU:   Understand  these instructions.  Will watch your condition.  Will get help right away if you are not doing well or get worse. Document Released: 03/11/2011 Document Revised: 12/05/2011 Document Reviewed: 03/11/2011 [facility] Patient Information 2015 [place], [place]. This information is not intended to replace advice given to you by your health care provider. Make sure you discuss any questions you have with your health care provider.

## 2014-08-16 NOTE — ED Notes (Signed)
Patient states diagnosed with bronchitis here on 07/27/14. States still has cough and hoarseness and generally still not feeling well.

## 2014-08-16 NOTE — ED Provider Notes (Signed)
Desiree Rosario is a 58 y.o. female who presents to Urgent Care today for cough. Patient has had a mild cough but persistent per the past several weeks. Additionally she feels as though she needs to clear her throat constantly. She additionally notes a mild hoarse voice. She was seen on November 1 for similar symptoms and was treated with antibiotics and prednisone for bronchitis. She has failed to improve much. No vomiting or diarrhea. No significant chest pain or palpitations. Mild occasional shortness of breath.   No past medical history on file. Past Surgical History  Procedure Laterality Date  . Breast lumpectomy    . Leg surgery      RT- fracture   History  Substance Use Topics  . Smoking status: Never Smoker   . Smokeless tobacco: Not on file  . Alcohol Use: No   ROS as above Medications: No current facility-administered medications for this encounter.   Current Outpatient Prescriptions  Medication Sig Dispense Refill  . Norethin Ace-Eth Estrad-FE (LOESTRIN 24 FE PO) Take by mouth daily.    Marland Kitchen guaiFENesin-codeine 100-10 MG/5ML syrup Take 5 mLs by mouth at bedtime as needed for cough. 120 mL 0  . ipratropium (ATROVENT) 0.06 % nasal spray Place 2 sprays into both nostrils 4 (four) times daily. 15 mL 1  . PredniSONE 5 MG KIT 12 day dosepack po 1 kit 0   No Known Allergies   Exam:  BP 121/81 mmHg  Pulse 60  Temp(Src) 97.8 F (36.6 C) (Oral)  Wt 163 lb (73.936 kg)  SpO2 100% Gen: Well NAD HEENT: EOMI,  MMM posterior pharynx cobblestoning. Normal tympanic membranes bilaterally. Lungs: Normal work of breathing. CTABL Heart: RRR no MRG Abd: NABS, Soft. Nondistended, Nontender Exts: Brisk capillary refill, warm and well perfused.   No results found for this or any previous visit (from the past 24 hour(s)). Dg Chest 2 View  08/16/2014   CLINICAL DATA:  Three weeks of cough.  EXAM: CHEST  2 VIEW  COMPARISON:  None.  FINDINGS: The heart size and mediastinal contours are within  normal limits. Both lungs are clear. The visualized skeletal structures are unremarkable.  IMPRESSION: No active cardiopulmonary disease.   Electronically Signed   By: Marin Olp M.D.   On: 08/16/2014 10:47    Assessment and Plan: 58 y.o. female with postnasal drip and cough. Longer tapering dose of prednisone and Atrovent nasal spray. Codeine containing cough medication as needed.  Discussed warning signs or symptoms. Please see discharge instructions. Patient expresses understanding.     Gregor Hams, MD 08/16/14 1058

## 2014-09-07 ENCOUNTER — Encounter: Payer: Self-pay | Admitting: *Deleted

## 2014-09-07 ENCOUNTER — Other Ambulatory Visit: Payer: Self-pay | Admitting: Family Medicine

## 2014-09-07 ENCOUNTER — Emergency Department (INDEPENDENT_AMBULATORY_CARE_PROVIDER_SITE_OTHER)
Admission: EM | Admit: 2014-09-07 | Discharge: 2014-09-07 | Disposition: A | Discharge status: Home / Self Care | Payer: BC Managed Care – PPO | Source: Home / Self Care | Attending: Family Medicine | Admitting: Family Medicine

## 2014-09-07 DIAGNOSIS — B9789 Other viral agents as the cause of diseases classified elsewhere: Principal | ICD-10-CM

## 2014-09-07 DIAGNOSIS — J069 Acute upper respiratory infection, unspecified: Secondary | ICD-10-CM

## 2014-09-07 MED ORDER — BENZONATATE 200 MG PO CAPS: 200.0000 mg | ORAL_CAPSULE | Freq: Every day | ORAL | Status: DC

## 2014-09-07 MED ORDER — CLARITHROMYCIN 500 MG PO TABS: 500.0000 mg | ORAL_TABLET | Freq: Two times a day (BID) | ORAL | Status: DC

## 2014-09-07 NOTE — Discharge Instructions (Signed)
Take plain Mucinex (1200 mg guaifenesin) twice daily for cough and congestion.  May add Sudafed for sinus congestion.   Increase fluid intake, rest. May use Afrin nasal spray (or generic oxymetazoline) twice daily for about 5 days.  Also recommend using saline nasal spray several times daily and saline nasal irrigation (AYR is a common brand) Try warm salt water gargles for sore throat.  Stop all antihistamines for now, and other non-prescription cough/cold preparations.

## 2014-09-07 NOTE — ED Provider Notes (Signed)
CSN: [id number]     Arrival date & time 09/07/14  1144 History   First MD Initiated Contact with Patient 09/07/14 1253     Chief Complaint  Patient presents with  . Facial Pain  . Nasal Congestion      HPI Comments: Patient was recently treated for bronchitis.  Her treatment included prednisone, but her cough persists, worse at night.  During the past two days she has developed sneezing, sinus congestion, myalgias, and facial pressure.  The history is provided by the patient.    History reviewed. No pertinent past medical history. Past Surgical History  Procedure Laterality Date  . Breast lumpectomy    . Leg surgery      RT- fracture   Family History  Problem Relation Age of Onset  . Cancer Mother     melanoma  . Diabetes Father    History  Substance Use Topics  . Smoking status: Never Smoker   . Smokeless tobacco: Not on file  . Alcohol Use: No   OB History    No data available     Review of Systems No sore throat + cough No pleuritic pain No wheezing + nasal congestion + post-nasal drainage + sinus pain/pressure No itchy/red eyes No earache No hemoptysis No SOB No fever/chills No nausea No vomiting No abdominal pain No diarrhea No urinary symptoms No skin rash + fatigue + myalgias + headache Used OTC meds without relief  Allergies  Review of patient's allergies indicates no known allergies.  Home Medications   Prior to Admission medications   Medication Sig Start Date End Date Taking? Authorizing Provider  benzonatate (TESSALON) 200 MG capsule Take 1 capsule (200 mg total) by mouth at bedtime. Take as needed for cough 09/07/14   Kandra Nicolas, MD  clarithromycin (BIAXIN) 500 MG tablet Take 1 tablet (500 mg total) by mouth 2 (two) times daily. Take with food) 09/07/14   Kandra Nicolas, MD  ipratropium (ATROVENT) 0.06 % nasal spray Place 2 sprays into both nostrils 4 (four) times daily. 08/16/14   Gregor Hams, MD  Norethin Ace-Eth Estrad-FE  (LOESTRIN 24 FE PO) Take by mouth daily.    Historical Provider, MD   BP 128/82 mmHg  Pulse 66  Temp(Src) 98.2 F (36.8 C) (Oral)  Ht 5' 4.5" (1.638 m)  Wt 165 lb (74.844 kg)  BMI 27.90 kg/m2  SpO2 100% Physical Exam Nursing notes and Vital Signs reviewed. Appearance:  Patient appears healthy, stated age, and in no acute distress Eyes:  Pupils are equal, round, and reactive to light and accomodation.  Extraocular movement is intact.  Conjunctivae are not inflamed  Ears:  Canals normal.  Tympanic membranes normal.  Nose:  Mildly congested turbinates.  No sinus tenderness.   Pharynx:  Normal Neck:  Supple.   Tender enlarged posterior nodes are palpated bilaterally  Lungs:  Clear to auscultation.  Breath sounds are equal.  Heart:  Regular rate and rhythm without murmurs, rubs, or gallops.  Abdomen:  Nontender without masses or hepatosplenomegaly.  Bowel sounds are present.  No CVA or flank tenderness.  Extremities:  No edema.  No calf tenderness Skin:  No rash present.   ED Course  Procedures  none     MDM   1. Viral URI with cough; vs ?pertussis    Pertussis PCR and culture pending Begin empiric Biaxin.  Prescription written for Benzonatate [facility]) to take at bedtime for night-time cough.  Take plain Mucinex (1200 mg guaifenesin) twice  daily for cough and congestion.  May add Sudafed for sinus congestion.   Increase fluid intake, rest. May use Afrin nasal spray (or generic oxymetazoline) twice daily for about 5 days.  Also recommend using saline nasal spray several times daily and saline nasal irrigation (AYR is a common brand) Try warm salt water gargles for sore throat.  Stop all antihistamines for now, and other non-prescription cough/cold preparations. Followup with Family Doctor if not improved in one week.     Kandra Nicolas, MD 09/11/14 [phone]

## 2014-09-07 NOTE — ED Notes (Signed)
Pt has had nasal congestion, cough, headaches 5/10, sinus/facial pressure for 2 days.  Pt fstates she feels liek it is in her head but is going ot her chest.  Was treated for bronchitis a week ago.

## 2014-09-10 LAB — BORDETELLA PERTUSSIS PCR
B parapertussis, DNA: NOT DETECTED
B pertussis, DNA: NOT DETECTED

## 2014-09-21 LAB — CULTURE, BORDETELLA PERTUSSIS

## 2014-12-17 ENCOUNTER — Emergency Department (INDEPENDENT_AMBULATORY_CARE_PROVIDER_SITE_OTHER)
Admission: EM | Admit: 2014-12-17 | Discharge: 2014-12-17 | Disposition: A | Discharge status: Home / Self Care | Payer: BLUE CROSS/BLUE SHIELD | Source: Home / Self Care | Attending: Family Medicine | Admitting: Family Medicine

## 2014-12-17 ENCOUNTER — Encounter: Payer: Self-pay | Admitting: *Deleted

## 2014-12-17 DIAGNOSIS — J111 Influenza due to unidentified influenza virus with other respiratory manifestations: Secondary | ICD-10-CM

## 2014-12-17 DIAGNOSIS — R69 Illness, unspecified: Principal | ICD-10-CM

## 2014-12-17 MED ORDER — OSELTAMIVIR PHOSPHATE 75 MG PO CAPS: 75.0000 mg | ORAL_CAPSULE | Freq: Two times a day (BID) | ORAL | Status: DC

## 2014-12-17 MED ORDER — GUAIFENESIN-CODEINE 100-10 MG/5ML PO SOLN: ORAL | Status: DC

## 2014-12-17 NOTE — Discharge Instructions (Signed)
Take plain guaifenesin (1200mg  extended release tabs such as Mucinex) twice daily, with plenty of water, for cough and congestion.  May add Pseudoephedrine (30mg , one or two every 4 to 6 hours) for sinus congestion.  Get adequate rest.   May use Afrin nasal spray (or generic oxymetazoline) twice daily for about 5 days.  Also recommend using saline nasal spray several times daily and saline nasal irrigation (AYR is a common brand).   Try warm salt water gargles for sore throat.  Stop all antihistamines for now, and other non-prescription cough/cold preparations. May take Ibuprofen 200mg , 4 tabs every 8 hours with food for chest/sternum discomfort, body aches, fever, etc.    Follow-up with family doctor if not improving about one week

## 2014-12-17 NOTE — ED Notes (Signed)
Pt c/o nonproductive cough, body aches, fatigue, HA, and SOB x 2 days.

## 2014-12-17 NOTE — ED Provider Notes (Signed)
CSN: [id number]     Arrival date & time 12/17/14  1018 History   First MD Initiated Contact with Patient 12/17/14 1111     Chief Complaint  Patient presents with  . Cough      HPI Comments: Complains of 2 day history flu-like illness including myalgias, headache, fever to 100/chills, fatigue, and cough.  Also has mild nasal congestion and sore throat.  Cough is non-productive and somewhat worse at night.  No pleuritic pain or wheezing but complains of shortness of breath.  She has had a flu shot this season.     The history is provided by the patient.    History reviewed. No pertinent past medical history. Past Surgical History  Procedure Laterality Date  . Breast lumpectomy    . Leg surgery      RT- fracture   Family History  Problem Relation Age of Onset  . Cancer Mother     melanoma  . Diabetes Father    History  Substance Use Topics  . Smoking status: Never Smoker   . Smokeless tobacco: Not on file  . Alcohol Use: No   OB History    No data available     Review of Systems + sore throat + hoarseness + cough No pleuritic pain No wheezing + nasal congestion + post-nasal drainage No sinus pain/pressure No itchy/red eyes No earache No hemoptysis + SOB + fever, + chills No nausea No vomiting No abdominal pain No diarrhea No urinary symptoms No skin rash + fatigue + myalgias + headache Used OTC meds without relief  Allergies  Review of patient's allergies indicates no known allergies.  Home Medications   Prior to Admission medications   Medication Sig Start Date End Date Taking? Authorizing Provider  guaiFENesin-codeine 100-10 MG/5ML syrup Take 70mL by mouth at bedtime as needed for cough 12/17/14   Kandra Nicolas, MD  ipratropium (ATROVENT) 0.06 % nasal spray Place 2 sprays into both nostrils 4 (four) times daily. 08/16/14   Gregor Hams, MD  Norethin Ace-Eth Estrad-FE (LOESTRIN 24 FE PO) Take by mouth daily.    Historical Provider, MD  oseltamivir  (TAMIFLU) 75 MG capsule Take 1 capsule (75 mg total) by mouth every 12 (twelve) hours. 12/17/14   Kandra Nicolas, MD   BP 110/74 mmHg  Pulse 73  Temp(Src) 99.2 F (37.3 C) (Oral)  Resp 18  Ht 5' 4.5" (1.638 m)  Wt 170 lb (77.111 kg)  BMI 28.74 kg/m2  SpO2 98% Physical Exam Nursing notes and Vital Signs reviewed. Appearance:  Patient appears stated age, and in no acute distress Eyes:  Pupils are equal, round, and reactive to light and accomodation.  Extraocular movement is intact.  Conjunctivae are not inflamed  Ears:  Canals normal.  Tympanic membranes normal.  Nose:  Mildly congested turbinates.  No sinus tenderness.   Pharynx:  Normal Neck:  Supple.   Enlarged tender posterior nodes are palpated bilaterally  Lungs:  Clear to auscultation.  Breath sounds are equal.  Chest:  Distinct tenderness to palpation over the mid-sternum.  Heart:  Regular rate and rhythm without murmurs, rubs, or gallops.  Abdomen:  Nontender without masses or hepatosplenomegaly.  Bowel sounds are present.  No CVA or flank tenderness.  Extremities:  No edema.  No calf tenderness Skin:  No rash present.   ED Course  Procedures  none  MDM   1. Influenza-like illness    Begin Tamiflu. Robitussin AC at bedtime. Take plain guaifenesin (1200mg   extended release tabs such as Mucinex) twice daily, with plenty of water, for cough and congestion.  May add Pseudoephedrine (30mg , one or two every 4 to 6 hours) for sinus congestion.  Get adequate rest.   May use Afrin nasal spray (or generic oxymetazoline) twice daily for about 5 days.  Also recommend using saline nasal spray several times daily and saline nasal irrigation (AYR is a common brand).   Try warm salt water gargles for sore throat.  Stop all antihistamines for now, and other non-prescription cough/cold preparations. May take Ibuprofen 200mg , 4 tabs every 8 hours with food for chest/sternum discomfort, body aches, fever, etc.    Follow-up with family doctor  if not improving about one week     Kandra Nicolas, MD 12/17/14 [phone]

## 2015-05-07 ENCOUNTER — Encounter: Payer: Self-pay | Admitting: Family Medicine

## 2015-05-07 ENCOUNTER — Ambulatory Visit (INDEPENDENT_AMBULATORY_CARE_PROVIDER_SITE_OTHER): Payer: BLUE CROSS/BLUE SHIELD | Admitting: Family Medicine

## 2015-05-07 VITALS — BP 120/69 | HR 67 | Ht 64.5 in | Wt 170.0 lb

## 2015-05-07 DIAGNOSIS — M5412 Radiculopathy, cervical region: Secondary | ICD-10-CM | POA: Diagnosis not present

## 2015-05-07 MED ORDER — PREDNISONE 20 MG PO TABS
ORAL_TABLET | ORAL | Status: AC
Start: 2015-05-07 — End: 2015-05-20

## 2015-05-07 NOTE — Progress Notes (Signed)
CC: Desiree Rosario is a 59 y.o. female is here for Establish Care and Neck Pain   Subjective: HPI:  Very pleasant 59 year old here to establish care, mother of Desiree Rosario  Complains of 2 weeks of daily posterior right neck pain. Symptoms came on overnight without any warning. She woke up with them and does not recall any overexertion or trauma preceding this. Pain is described as a dull ache. It's worse when rotating the neck to the left or with side bending to the right or left. She has a pressure sensation in the back of her neck if leaning forward. The pain is never been pulsatile. She has a history of requiring radio frequency ablation of Nerves lower in the posterior neck but this pain feels different than the pain that prompted that intervention. She's tried ibuprofen without much benefit. No other interventions as of yet. She denies any motor or sensory disturbances in the upper extremities or elsewhere. She denies any skin changes. She denies any hearing changes or vision changes. Pain is mild in severity.  Review of Systems - General ROS: negative for - chills, fever, night sweats, weight gain or weight loss Ophthalmic ROS: negative for - decreased vision Psychological ROS: negative for - anxiety or depression ENT ROS: negative for - hearing change, nasal congestion, tinnitus or allergies Hematological and Lymphatic ROS: negative for - bleeding problems, bruising or swollen lymph nodes Breast ROS: negative Respiratory ROS: no cough, shortness of breath, or wheezing Cardiovascular ROS: no chest pain or dyspnea on exertion Gastrointestinal ROS: no abdominal pain, change in bowel habits, or black or bloody stools Genito-Urinary ROS: negative for - genital discharge, genital ulcers, incontinence or abnormal bleeding from genitals Musculoskeletal ROS: negative for - joint pain or muscle pain other than that described above Neurological ROS: negative for - headaches or memory  loss Dermatological ROS: negative for lumps, mole changes, rash and skin lesion changes   History reviewed. No pertinent past medical history.  Past Surgical History  Procedure Laterality Date  . Breast lumpectomy    . Leg surgery      RT- fracture  . Rotator cuff repair Right    Family History  Problem Relation Age of Onset  . Cancer Mother     melanoma  . Diabetes Father   . Stroke Maternal Grandmother     Social History   Social History  . Marital Status: Married    Spouse Name: N/A  . Number of Children: N/A  . Years of Education: N/A   Occupational History  . Not on file.   Social History Main Topics  . Smoking status: Never Smoker   . Smokeless tobacco: Not on file  . Alcohol Use: No  . Drug Use: No  . Sexual Activity: Not Currently   Other Topics Concern  . Not on file   Social History Narrative     Objective: BP 120/69 mmHg  Pulse 67  Ht 5' 4.5" (1.638 m)  Wt 170 lb (77.111 kg)  BMI 28.74 kg/m2  General: Alert and Oriented, No Acute Distress HEENT: Pupils equal, round, reactive to light. Conjunctivae clear.  External ears unremarkable, canals clear with intact TMs with appropriate landmarks.  Middle ear appears open without effusion. Pink inferior turbinates.  Moist mucous membranes, pharynx without inflammation nor lesions.  Neck supple without palpable lymphadenopathy nor abnormal masses. Lungs:Clear and comfortable work of breathing  Cardiac: Regular rate and rhythm.  Neck: Full range of motion and strength in all 3 planes of  motion. No midline spinous process tenderness or paraspinal musculature tenderness in the cervical spine. No palpable masses in the neck. Spurling's negative bilaterally.  Neuro: CN II-XII grossly intact, full strength/rom of all four extremities, C5/L4/S1 DTRs 2/4 bilaterally, gait normal Extremities: No peripheral edema.  Strong peripheral pulses.  Mental Status: No depression, anxiety, nor agitation. Skin: Warm and  dry.  Assessment & Plan: Desiree Rosario was seen today for establish care and neck pain.  Diagnoses and all orders for this visit:  Cervical radiculitis -     predniSONE (DELTASONE) 20 MG tablet; Three tabs daily days 1-3, two tabs daily days 4-6, one tab daily days 7-9, half tab daily days 10-13.   Cervical radiculitis: Start prednisone taper. They're still possibility this could be due to facet joint arthritis however I believe prednisone would help with this as well. If not feeling any better by Monday or Tuesday please call and I will arrange cervical x-rays.  Signs and symptoms requring emergent/urgent reevaluation were discussed with the patient.  Return if symptoms worsen or fail to improve.

## 2015-08-24 IMAGING — CR DG FOOT COMPLETE 3+V*L*
3 series · 3 of 3 positions shown · non-contrast
Comparison: Concurrently obtained radiographs of the left ankle

CLINICAL DATA: Left foot pain

LEFT FOOT - COMPLETE 3+ VIEW

[view not recorded (1 of 3)]
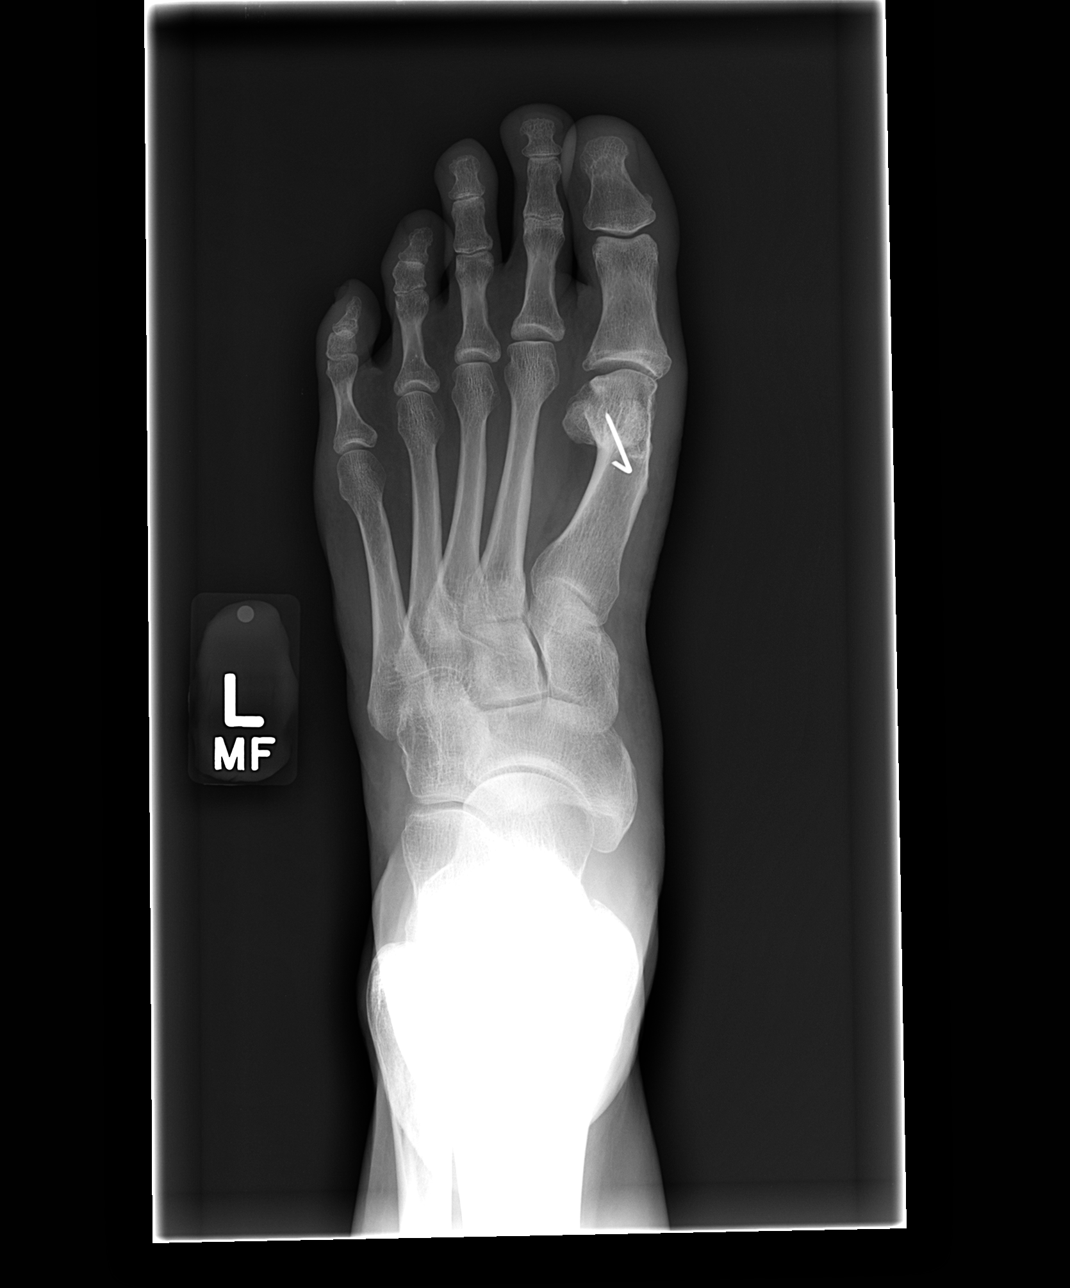

[view not recorded (2 of 3)]
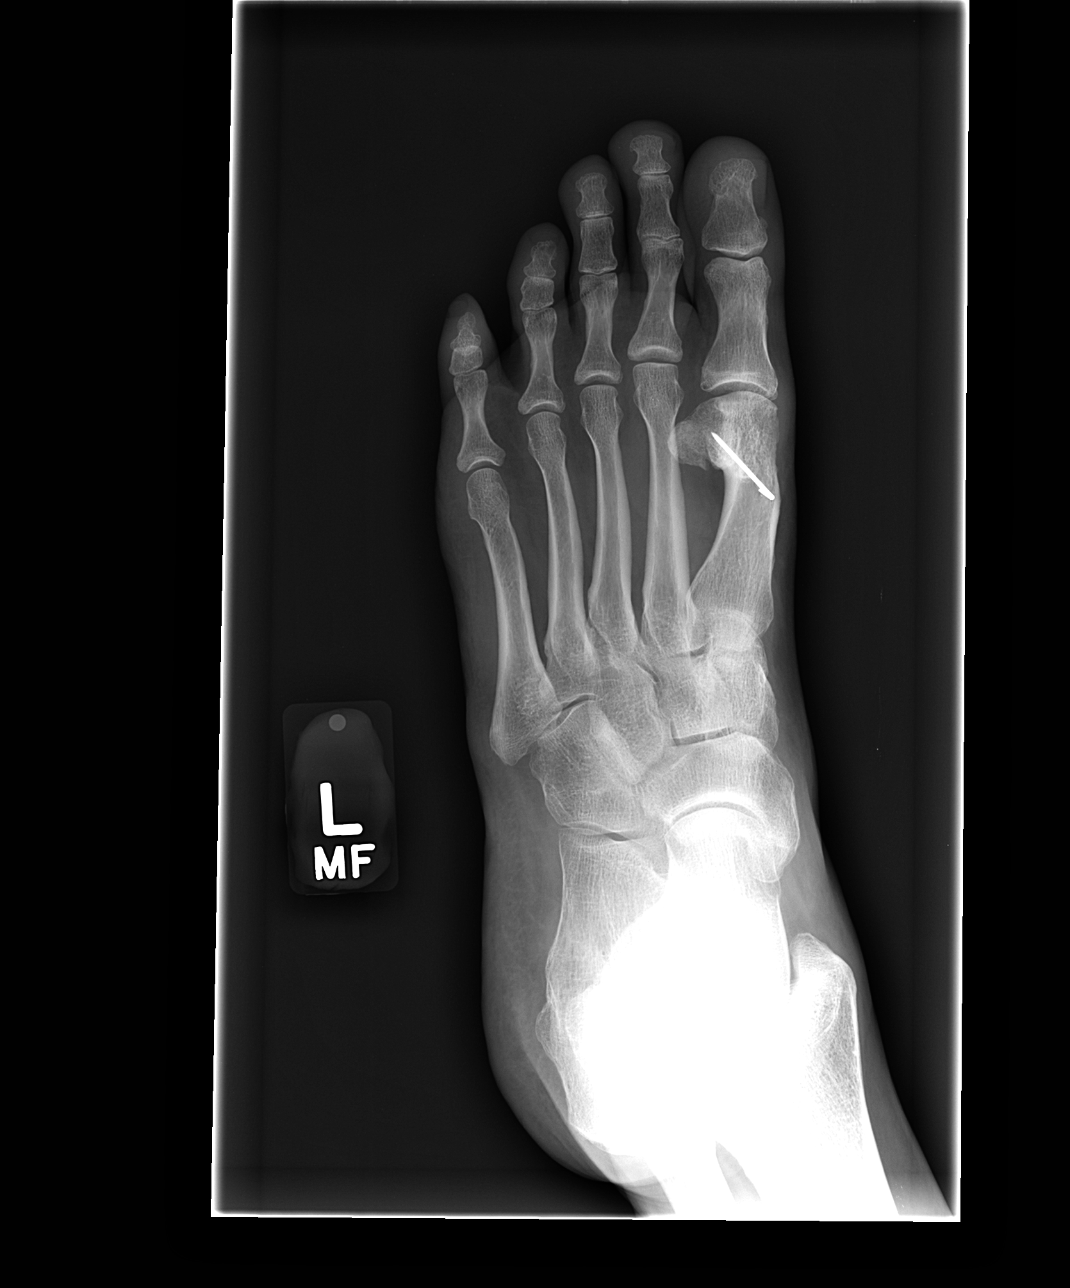

[view not recorded (3 of 3)]
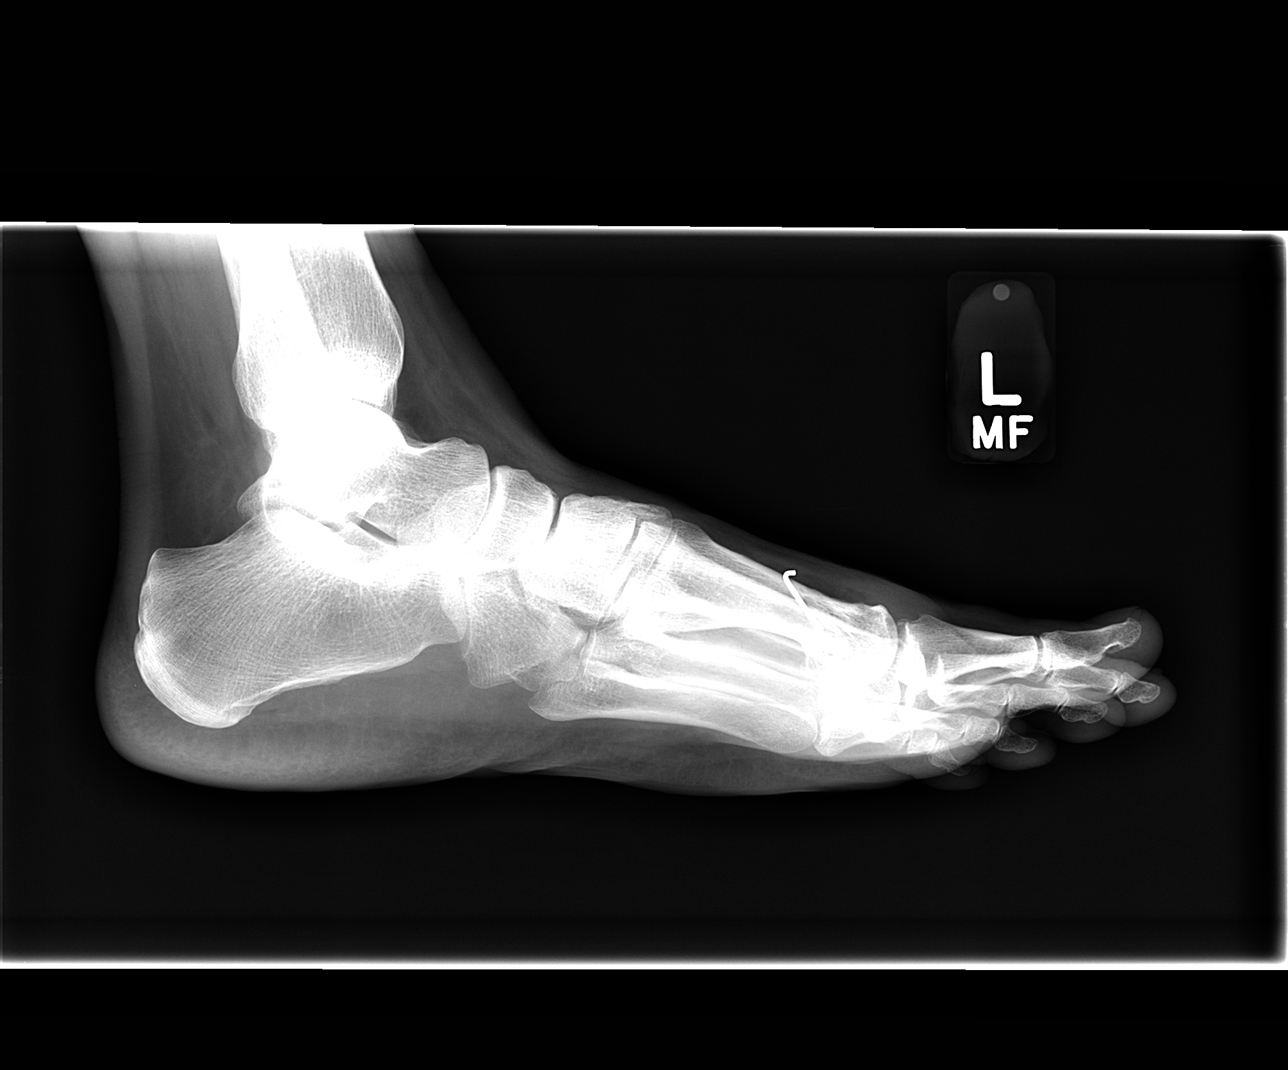

[3 of 3 positions shown; findings below may reference images not displayed]

FINDINGS: Three radiographs the left foot demonstrate prior
surgical changes of hallux valgus repair.  18 is noted within the
healed osteotomy site.  No evidence of hardware complication.  No
acute fracture, or malalignment.  Mild degenerative changes noted
at the great toe MTP joint.  No focal soft tissue swelling.  Normal
bony mineralization.
IMPRESSION: Surgical changes of healed first metatarsal osteotomy for hallux
valgus repair without evidence of complication.

No acute osseous abnormality.

Mild degenerative change noted at the great toe MTP joint.

## 2015-11-13 ENCOUNTER — Ambulatory Visit (INDEPENDENT_AMBULATORY_CARE_PROVIDER_SITE_OTHER): Payer: BLUE CROSS/BLUE SHIELD | Admitting: Family Medicine

## 2015-11-13 ENCOUNTER — Encounter: Payer: Self-pay | Admitting: Family Medicine

## 2015-11-13 VITALS — BP 124/80 | HR 63 | Wt 176.0 lb

## 2015-11-13 DIAGNOSIS — J309 Allergic rhinitis, unspecified: Secondary | ICD-10-CM | POA: Insufficient documentation

## 2015-11-13 DIAGNOSIS — Z0000 Encounter for general adult medical examination without abnormal findings: Secondary | ICD-10-CM | POA: Diagnosis not present

## 2015-11-13 DIAGNOSIS — K589 Irritable bowel syndrome without diarrhea: Secondary | ICD-10-CM

## 2015-11-13 MED ORDER — MONTELUKAST SODIUM 10 MG PO TABS: 10.0000 mg | ORAL_TABLET | Freq: Every day | ORAL | Status: DC

## 2015-11-13 NOTE — Progress Notes (Signed)
CC: Desiree Rosario is a 60 y.o. female is here for Allergies   Subjective: HPI:  For the past 2 months patient has been experiencing nasal congestion, sneezing, nonproductive cough and sore throat. No benefit from over-the-counter cough and cold medications. Initially she felt like she was suffering from a sinus infection however she tells me she feels great other than the above symptoms which are now present to a mild degree in all environments on a daily basis. She is taking Allegra however this is only helping to a mild degree. She denies shortness of breath, wheezing, fevers, chills, nor rash.  She is a history of IBS in the past has been prescribed Xifaxan she wants know if I can give her a prescription for this. Originally was provided by her gastroenterologist. She tells me she has diarrhea if she doesn't watch her diet and that the above medication has helped her greatly for months at a time she takes a 2 week regimen. She believes she has used 3 rounds of this in the last year   Review Of Systems Outlined In HPI  No past medical history on file.  Past Surgical History  Procedure Laterality Date  . Breast lumpectomy    . Leg surgery      RT- fracture  . Rotator cuff repair Right    Family History  Problem Relation Age of Onset  . Cancer Mother     melanoma  . Diabetes Father   . Stroke Maternal Grandmother     Social History   Social History  . Marital Status: Married    Spouse Name: N/A  . Number of Children: N/A  . Years of Education: N/A   Occupational History  . Not on file.   Social History Main Topics  . Smoking status: Never Smoker   . Smokeless tobacco: Not on file  . Alcohol Use: No  . Drug Use: No  . Sexual Activity: Not Currently   Other Topics Concern  . Not on file   Social History Narrative     Objective: BP 124/80 mmHg  Pulse 63  Wt 176 lb (79.833 kg)  General: Alert and Oriented, No Acute Distress HEENT: Pupils equal, round, reactive to  light. Conjunctivae clear.  External ears unremarkable, canals clear with intact TMs with appropriate landmarks.  Middle ear appears open without effusion. Pink inferior turbinates.  Moist mucous membranes, pharynx without inflammation nor lesions.  Neck supple without palpable lymphadenopathy nor abnormal masses. Lungs: Clear to auscultation bilaterally, no wheezing/ronchi/rales.  Comfortable work of breathing. Good air movement. Cardiac: Regular rate and rhythm. Normal S1/S2.  No murmurs, rubs, nor gallops.   Extremities: No peripheral edema.  Strong peripheral pulses.  Mental Status: No depression, anxiety, nor agitation. Skin: Warm and dry.  Assessment & Plan: Trayana was seen today for allergies.  Diagnoses and all orders for this visit:  Allergic rhinitis, unspecified allergic rhinitis type  Annual physical exam -     Lipid panel -     CBC -     COMPLETE METABOLIC PANEL WITH GFR  IBS (irritable bowel syndrome)  Other orders -     montelukast (SINGULAIR) 10 MG tablet; Take 1 tablet (10 mg total) by mouth at bedtime.   Allergic rhinitis: Start montelukast and if no better after 2-3 days start either Nasacort or Flonase. IBS: Discussed that the medication she is requesting is not something that I'm familiar with prescribing. She has an appointment with her gastroenterologist next month and encouraged to  bring it up to him since he originally prescribed.  She has a physical with me next week and would like to have blood work done prior to the visit.  25 minutes spent face-to-face during visit today of which at least 50% was counseling or coordinating care regarding: 1. Allergic rhinitis, unspecified allergic rhinitis type   2. Annual physical exam   3. IBS (irritable bowel syndrome)      Return if symptoms worsen or fail to improve.

## 2015-11-20 ENCOUNTER — Ambulatory Visit (INDEPENDENT_AMBULATORY_CARE_PROVIDER_SITE_OTHER): Payer: BLUE CROSS/BLUE SHIELD | Admitting: Family Medicine

## 2015-11-20 ENCOUNTER — Telehealth: Payer: Self-pay | Admitting: Family Medicine

## 2015-11-20 ENCOUNTER — Encounter: Payer: Self-pay | Admitting: Family Medicine

## 2015-11-20 VITALS — BP 124/80 | HR 69 | Wt 176.0 lb

## 2015-11-20 DIAGNOSIS — K580 Irritable bowel syndrome with diarrhea: Secondary | ICD-10-CM

## 2015-11-20 DIAGNOSIS — C44320 Squamous cell carcinoma of skin of unspecified parts of face: Secondary | ICD-10-CM | POA: Diagnosis not present

## 2015-11-20 DIAGNOSIS — Z0000 Encounter for general adult medical examination without abnormal findings: Secondary | ICD-10-CM | POA: Diagnosis not present

## 2015-11-20 DIAGNOSIS — Z86010 Personal history of colonic polyps: Secondary | ICD-10-CM | POA: Diagnosis not present

## 2015-11-20 LAB — CBC
HCT: 39.3 % (ref 36.0–46.0)
Hemoglobin: 12.7 g/dL (ref 12.0–15.0)
MCH: 29.5 pg (ref 26.0–34.0)
MCHC: 32.3 g/dL (ref 30.0–36.0)
MCV: 91.4 fL (ref 78.0–100.0)
MPV: 11.5 fL (ref 8.6–12.4)
PLATELETS: 232 10*3/uL (ref 150–400)
RBC: 4.3 MIL/uL (ref 3.87–5.11)
RDW: 13.4 % (ref 11.5–15.5)
WBC: 4.8 10*3/uL (ref 4.0–10.5)

## 2015-11-20 MED ORDER — RIFAXIMIN 550 MG PO TABS: 550.0000 mg | ORAL_TABLET | Freq: Three times a day (TID) | ORAL | Status: DC

## 2015-11-20 NOTE — Telephone Encounter (Signed)
Will you please let patient know that I did some reading and it looks like she is a candidate for taking another reigmen of Xifaxan. I'll print off an Rx and a savings card if she'd like to pick these up.

## 2015-11-20 NOTE — Progress Notes (Signed)
CC: Desiree Rosario is a 60 y.o. female is here for Annual Exam   Subjective: HPI:  Colonoscopy: 5 year clearance from 10/2014 Papsmear: UTD from last year Mammogram:Overdue, offered but she would like to follow through with this via [facility]  Influenza Vaccine: utd Pneumovax: no indication Td/Tdap: utd Zoster: (Start 60 yo)   bilateral knee pain going up stairs  Review of Systems - General ROS: negative for - chills, fever, night sweats, weight gain or weight loss Ophthalmic ROS: negative for - decreased vision Psychological ROS: negative for - anxiety or depression ENT ROS: negative for - hearing change, nasal congestion, tinnitus or allergies Hematological and Lymphatic ROS: negative for - bleeding problems, bruising or swollen lymph nodes Breast ROS: negative Respiratory ROS: no cough, shortness of breath, or wheezing Cardiovascular ROS: no chest pain or dyspnea on exertion Gastrointestinal ROS: no abdominal pain, change in bowel habits, or black or bloody stools Genito-Urinary ROS: negative for - genital discharge, genital ulcers, incontinence or abnormal bleeding from genitals Musculoskeletal ROS: negative for - joint pain or muscle pain other than that described above Neurological ROS: negative for - headaches or memory loss Dermatological ROS: negative for lumps, mole changes, rash and skin lesion changes   No past medical history on file.  Past Surgical History  Procedure Laterality Date  . Breast lumpectomy    . Leg surgery      RT- fracture  . Rotator cuff repair Right    Family History  Problem Relation Age of Onset  . Cancer Mother     melanoma  . Diabetes Father   . Stroke Maternal Grandmother     Social History   Social History  . Marital Status: Married    Spouse Name: N/A  . Number of Children: N/A  . Years of Education: N/A   Occupational History  . Not on file.   Social History Main Topics  . Smoking status: Never Smoker   .  Smokeless tobacco: Not on file  . Alcohol Use: No  . Drug Use: No  . Sexual Activity: Not Currently   Other Topics Concern  . Not on file   Social History Narrative     Objective: BP 124/80 mmHg  Pulse 69  Wt 176 lb (79.833 kg)  General: No Acute Distress HEENT: Atraumatic, normocephalic, conjunctivae normal without scleral icterus.  No nasal discharge, hearing grossly intact, TMs with good landmarks bilaterally with no middle ear abnormalities, posterior pharynx clear without oral lesions. Neck: Supple, trachea midline, no cervical nor supraclavicular adenopathy. Pulmonary: Clear to auscultation bilaterally without wheezing, rhonchi, nor rales. Cardiac: Regular rate and rhythm.  No murmurs, rubs, nor gallops. No peripheral edema.  2+ peripheral pulses bilaterally. Abdomen: Bowel sounds normal.  No masses.  Non-tender without rebound.  Negative Murphy's sign. MSK: Grossly intact, no signs of weakness.  Full strength throughout upper and lower extremities.  Full ROM in upper and lower extremities.  No midline spinal tenderness. Neuro: Gait unremarkable, CN II-XII grossly intact.  C5-C6 Reflex 2/4 Bilaterally, L4 Reflex 2/4 Bilaterally.  Cerebellar function intact. Skin: No rashes. Psych: Alert and oriented to person/place/time.  Thought process normal. No anxiety/depression.   Assessment & Plan: [name] was seen today for annual exam.  Diagnoses and all orders for this visit:  Squamous cell carcinoma, face  History of colonic polyps  Annual physical exam  Healthy lifestyle interventions including but not limited to regular exercise, a healthy low fat diet, moderation of salt intake, the dangers of tobacco/alcohol/recreational  drug use, nutrition supplementation, and accident avoidance were discussed with the patient and a handout was provided for future reference.  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  Return if symptoms worsen or fail to  improve.

## 2015-11-20 NOTE — Patient Instructions (Signed)
Glucosamine and Chondroitin

## 2015-11-20 NOTE — Telephone Encounter (Signed)
Pt.notified

## 2015-11-21 LAB — LIPID PANEL
CHOL/HDL RATIO: 3.2 ratio (ref <=5.0)
Cholesterol: 217 mg/dL — ABNORMAL HIGH (ref 125–200)
HDL: 68 mg/dL (ref >=46)
LDL CALC: 133 mg/dL — AB (ref <130)
TRIGLYCERIDES: 80 mg/dL (ref <150)
VLDL: 16 mg/dL (ref <30)

## 2015-11-21 LAB — COMPLETE METABOLIC PANEL WITH GFR
ALT: 14 U/L (ref 6–29)
AST: 17 U/L (ref 10–35)
Albumin: 3.9 g/dL (ref 3.6–5.1)
Alkaline Phosphatase: 92 U/L (ref 33–130)
BUN: 15 mg/dL (ref 7–25)
CHLORIDE: 106 mmol/L (ref 98–110)
CO2: 28 mmol/L (ref 20–31)
Calcium: 9.2 mg/dL (ref 8.6–10.4)
Creat: 0.81 mg/dL (ref 0.50–1.05)
GFR, EST NON AFRICAN AMERICAN: 80 mL/min (ref >=60)
GFR, Est African American: >89 mL/min (ref >=60)
GLUCOSE: 94 mg/dL (ref 65–99)
POTASSIUM: 4.7 mmol/L (ref 3.5–5.3)
SODIUM: 141 mmol/L (ref 135–146)
TOTAL PROTEIN: 6.6 g/dL (ref 6.1–8.1)
Total Bilirubin: 0.5 mg/dL (ref 0.2–1.2)

## 2015-11-23 ENCOUNTER — Telehealth: Payer: Self-pay | Admitting: Family Medicine

## 2015-11-23 DIAGNOSIS — E785 Hyperlipidemia, unspecified: Secondary | ICD-10-CM

## 2015-11-23 NOTE — Telephone Encounter (Signed)
Patient requests being contacted at [phone].  Will you please let her know that her blood sugar, kidney function, liver function, and blood cell counts were all normal.  Her LDL cholesterol was moderately elevated but still below a level that needs cholesterol medication. This can be improved with engaging in 30-45 minutes of moderate exercise most days of the week.

## 2015-11-23 NOTE — Telephone Encounter (Signed)
Pt.notified

## 2016-04-29 ENCOUNTER — Encounter: Payer: Self-pay | Admitting: Family Medicine

## 2016-04-29 ENCOUNTER — Ambulatory Visit (INDEPENDENT_AMBULATORY_CARE_PROVIDER_SITE_OTHER): Payer: BLUE CROSS/BLUE SHIELD | Admitting: Family Medicine

## 2016-04-29 VITALS — BP 139/93 | HR 60 | Wt 169.0 lb

## 2016-04-29 DIAGNOSIS — M5412 Radiculopathy, cervical region: Secondary | ICD-10-CM

## 2016-04-29 DIAGNOSIS — R1310 Dysphagia, unspecified: Secondary | ICD-10-CM

## 2016-04-29 MED ORDER — PREDNISONE 20 MG PO TABS: ORAL_TABLET | ORAL | 0 refills | Status: AC

## 2016-04-29 MED ORDER — ZOSTER VACCINE LIVE 19400 UNT/0.65ML ~~LOC~~ SUSR: 0.6500 mL | Freq: Once | SUBCUTANEOUS | 0 refills | Status: AC

## 2016-04-29 NOTE — Progress Notes (Signed)
CC: Desiree Rosario is a 60 y.o. female is here for Headache and Neck Pain   Subjective: HPI:  1 week ago the day after she was strenuously shoveling gravel she had posterior neck pain that radiates into the left shoulder. It's present on a daily basis now worse when looking to the right. No intervention as of yet. She's had this in the past and responded to prednisone. She denies any motor or sensory disturbances in the upper extremities nor headache. She denies any overlying skin changes nor any midline cervical pain. Symptoms are moderate in severity.  The past month she is experiencing a sensation of dysphagia with solids and liquids. Interventions have included Protonix and a barium swallow study neither of which were beneficial or revealing. She tells me feels like whenever she consumes gets stuck in the top of her esophagus. Denies unintentional weight loss or any palpable abnormality in her neck. Denies regurgitation or choking   Review Of Systems Outlined In HPI  No past medical history on file.  Past Surgical History:  Procedure Laterality Date  . BREAST LUMPECTOMY    . LEG SURGERY     RT- fracture  . ROTATOR CUFF REPAIR Right    Family History  Problem Relation Age of Onset  . Cancer Mother     melanoma  . Diabetes Father   . Stroke Maternal Grandmother     Social History   Social History  . Marital status: Married    Spouse name: N/A  . Number of children: N/A  . Years of education: N/A   Occupational History  . Not on file.   Social History Main Topics  . Smoking status: Never Smoker  . Smokeless tobacco: Not on file  . Alcohol use No  . Drug use: No  . Sexual activity: Not Currently   Other Topics Concern  . Not on file   Social History Narrative  . No narrative on file     Objective: BP (!) 139/93   Pulse 60   Wt 169 lb (76.7 kg)   BMI 28.56 kg/m   General: Alert and Oriented, No Acute Distress HEENT: Pupils equal, round, reactive to light.  Conjunctivae clear.  External ears unremarkable, canals clear with intact TMs with appropriate landmarks.  .  Neck supple without palpable lymphadenopathy nor abnormal masses. Lungs: Clear to auscultation bilaterally, no wheezing/ronchi/rales.  Comfortable work of breathing. Good air movement. Cardiac: Regular rate and rhythm. Normal S1/S2.  No murmurs, rubs, nor gallops.   Back: No midline spinous process tenderness in the cervical region. Full range of motion in all 3 planes in the cervical spine. No paraspinal musculature hypertrophy. Extremities: No peripheral edema.  Strong peripheral pulses.  Mental Status: No depression, anxiety, nor agitation. Skin: Warm and dry.  Assessment & Plan: Zeanna was seen today for headache and neck pain.  Diagnoses and all orders for this visit:  Dysphagia  Cervical radiculitis -     predniSONE (DELTASONE) 20 MG tablet; Three tabs daily days 1-3, two tabs daily days 4-6, one tab daily days 7-9, half tab daily days 10-13.  Other orders -     Zoster Vaccine Live, PF, (ZOSTAVAX) 16109 UNT/0.65ML injection; Inject 19,400 Units into the skin once.  Dysphagia: Original plan was to get an ultrasound of the neck however she tells me that she's having some postnasal drip ever since the dysphagia started, I like to see if prednisone prescribed for her radiculitis helps with the dysphagia by helping resolve postnasal  drip Cervical retinitis: Start prednisone taper. If no better by the middle of next week next step would be an MRI.  Return if symptoms worsen or fail to improve.

## 2016-05-16 ENCOUNTER — Ambulatory Visit (INDEPENDENT_AMBULATORY_CARE_PROVIDER_SITE_OTHER): Payer: BLUE CROSS/BLUE SHIELD | Admitting: Family Medicine

## 2016-05-16 ENCOUNTER — Encounter: Payer: Self-pay | Admitting: Family Medicine

## 2016-05-16 ENCOUNTER — Telehealth: Payer: Self-pay | Admitting: Family Medicine

## 2016-05-16 ENCOUNTER — Ambulatory Visit: Payer: BLUE CROSS/BLUE SHIELD

## 2016-05-16 VITALS — BP 131/84 | HR 71 | Wt 169.0 lb

## 2016-05-16 DIAGNOSIS — M79602 Pain in left arm: Secondary | ICD-10-CM | POA: Diagnosis not present

## 2016-05-16 DIAGNOSIS — M79605 Pain in left leg: Secondary | ICD-10-CM | POA: Diagnosis not present

## 2016-05-16 MED ORDER — GABAPENTIN 300 MG PO CAPS: 300.0000 mg | ORAL_CAPSULE | Freq: Three times a day (TID) | ORAL | 1 refills | Status: DC | PRN

## 2016-05-16 NOTE — Telephone Encounter (Signed)
Will you please let patient know that her ultrasound has ruled out the possibility of a blood clot.  I think she's suffering from sciatica and would recommend she start some home stretches (in your in box) and gabapentin.  Please let me know if it doesn't seem to be helping by tomorrow and I can send in something stronger if needed.

## 2016-05-16 NOTE — Progress Notes (Signed)
CC: Desiree Rosario is a 60 y.o. female is here for Numbness (left leg)   Subjective: HPI:  Left posterior leg pain present since Saturday evening. It started off as a tingling sensation on the bottom of her foot. It evolved into involving the entire posterior aspect of her leg from the mid thigh distally. She denies any overlying skin changes or recent trauma or overexertion. No interventions as of yet. Pain was bad enough to where it awoke her from her sleep around 3 AM this morning. It's worse with lying, sitting or any movement. She's never had this type of pain before. Moderate to severe in severity. She denies any shortness of breath, fever, nor swelling of any of the appendages.   Review Of Systems Outlined In HPI  No past medical history on file.  Past Surgical History:  Procedure Laterality Date  . BREAST LUMPECTOMY    . LEG SURGERY     RT- fracture  . ROTATOR CUFF REPAIR Right    Family History  Problem Relation Age of Onset  . Cancer Mother     melanoma  . Diabetes Father   . Stroke Maternal Grandmother     Social History   Social History  . Marital status: Married    Spouse name: N/A  . Number of children: N/A  . Years of education: N/A   Occupational History  . Not on file.   Social History Main Topics  . Smoking status: Never Smoker  . Smokeless tobacco: Not on file  . Alcohol use No  . Drug use: No  . Sexual activity: Not Currently   Other Topics Concern  . Not on file   Social History Narrative  . No narrative on file     Objective: BP 131/84   Pulse 71   Wt 169 lb (76.7 kg)   BMI 28.56 kg/m   General: Alert and Oriented, No Acute Distress HEENT: Pupils equal, round, reactive to light. Conjunctivae clear.  Moist mucous membranes Lungs: Clear to auscultation bilaterally, no wheezing/ronchi/rales.  Comfortable work of breathing. Good air movement. Cardiac: Regular rate and rhythm. Normal S1/S2.  No murmurs, rubs, nor gallops.   Extremities: No  peripheral edema.  Strong peripheral pulses.  Right leg exam: No reproduction of pain with internal or external rotation of the leg. Straight leg raise is negative, no palpable mass on the muscle belly of the calf or hamstrings. Palpating the muscles of the calf and hamstrings reproduces pain. Passive dorsiflexion of the foot reproduces her pain. Mental Status: No depression, anxiety, nor agitation. Skin: Warm and dry.  Assessment & Plan: Desiree Rosario was seen today for numbness.  Diagnoses and all orders for this visit:  Leg pain, posterior, left -     US Venous Img Lower Unilateral Left   Lumbar radiculitis versus DVT, rule out the latter of the 2 with an ultrasound today. The plan will be based on the above results.  No Follow-up on file.

## 2016-05-16 NOTE — Telephone Encounter (Signed)
Pt.notified

## 2016-05-25 ENCOUNTER — Ambulatory Visit (INDEPENDENT_AMBULATORY_CARE_PROVIDER_SITE_OTHER): Payer: BLUE CROSS/BLUE SHIELD

## 2016-05-25 ENCOUNTER — Encounter: Payer: Self-pay | Admitting: Family Medicine

## 2016-05-25 ENCOUNTER — Ambulatory Visit (INDEPENDENT_AMBULATORY_CARE_PROVIDER_SITE_OTHER): Payer: BLUE CROSS/BLUE SHIELD | Admitting: Family Medicine

## 2016-05-25 DIAGNOSIS — M5412 Radiculopathy, cervical region: Secondary | ICD-10-CM

## 2016-05-25 NOTE — Progress Notes (Signed)
CC: Desiree Rosario is a 60 y.o. female is here for Neck Pain and Shoulder Pain   Subjective: HPI:  Lateral neck pain left less than right that has been present for the majority of this month. It waxes and wanes. It was better when taking prednisone. Over the past week she's had a component of her pain that radiates into the left arm causing a sensation that she recently has to move or reposition her arm. She denies any exertional component to her pain. She denies any motor or sensory disturbances in the upper extremities. About a decade ago she required epidurals between C7 and C1. She denies any recent trauma since I saw her last. She denies any midline pain. It's worse with bending forward. She denies any cough, shortness of breath or chest discomfort. Left arm pain is intermittent however right arm pain seems to be persistent and goes into the humerus   Review Of Systems Outlined In HPI  No past medical history on file.  Past Surgical History:  Procedure Laterality Date  . BREAST LUMPECTOMY    . LEG SURGERY     RT- fracture  . ROTATOR CUFF REPAIR Right    Family History  Problem Relation Age of Onset  . Cancer Mother     melanoma  . Diabetes Father   . Stroke Maternal Grandmother     Social History   Social History  . Marital status: Married    Spouse name: N/A  . Number of children: N/A  . Years of education: N/A   Occupational History  . Not on file.   Social History Main Topics  . Smoking status: Never Smoker  . Smokeless tobacco: Not on file  . Alcohol use No  . Drug use: No  . Sexual activity: Not Currently   Other Topics Concern  . Not on file   Social History Narrative  . No narrative on file     Objective: BP (!) 146/85   Pulse 65   Wt 173 lb (78.5 kg)   BMI 29.24 kg/m   Vital signs reviewed. General: Alert and Oriented, No Acute Distress HEENT: Pupils equal, round, reactive to light. Conjunctivae clear.  External ears unremarkable.  Moist mucous  membranes. Lungs: Clear and comfortable work of breathing, speaking in full sentences without accessory muscle use. Cardiac: Regular rate and rhythm.  Neuro: CN II-XII grossly intact, gait normal. Right shoulder exam reveals full range of motion and strength in all planes of motion and with individual rotator cuff testing. No overlying redness warmth or swelling.  Neer's test negative.  Hawkins test negative. Empty can negative. Crossarm test negative. O'Brien's test negative. Apprehension test negative. Speed's test negative. Extremities: No peripheral edema.  Strong peripheral pulses.  Mental Status: No depression, anxiety, nor agitation. Logical though process. Skin: Warm and dry.  Assessment & Plan: Desiree Rosario was seen today for neck pain and shoulder pain.  Diagnoses and all orders for this visit:  Cervical radiculitis -     DG Cervical Spine Complete   I think she is eventually going to require an MRI, checking plain films first.   No Follow-up on file.

## 2016-05-26 ENCOUNTER — Telehealth: Payer: Self-pay | Admitting: Family Medicine

## 2016-05-26 DIAGNOSIS — M5412 Radiculopathy, cervical region: Secondary | ICD-10-CM

## 2016-05-26 NOTE — Telephone Encounter (Signed)
Will you please let patient know that her xrays did not show any abnormality to account for her discomfort so as we discussed I'm going to place an order for an MRI here in [place].

## 2016-05-26 NOTE — Telephone Encounter (Signed)
Pt.notified

## 2016-05-31 ENCOUNTER — Telehealth: Payer: Self-pay | Admitting: Family Medicine

## 2016-05-31 ENCOUNTER — Ambulatory Visit (INDEPENDENT_AMBULATORY_CARE_PROVIDER_SITE_OTHER): Payer: BLUE CROSS/BLUE SHIELD

## 2016-05-31 DIAGNOSIS — M5412 Radiculopathy, cervical region: Secondary | ICD-10-CM

## 2016-05-31 DIAGNOSIS — M4312 Spondylolisthesis, cervical region: Secondary | ICD-10-CM | POA: Diagnosis not present

## 2016-05-31 NOTE — Telephone Encounter (Signed)
Will you please let patient know that her MRI actually looked pretty good.  There was some mild degenerative changes but no signs of spinal cord or nerve impingement.  This is good news since it's very likely physical therapy will help resolve this.  An order has been placed.

## 2016-05-31 NOTE — Telephone Encounter (Signed)
Pt notified of recommendations

## 2016-06-10 ENCOUNTER — Encounter: Payer: Self-pay | Admitting: Rehabilitative and Restorative Service Providers"

## 2016-06-10 ENCOUNTER — Ambulatory Visit (INDEPENDENT_AMBULATORY_CARE_PROVIDER_SITE_OTHER): Payer: BLUE CROSS/BLUE SHIELD | Admitting: Rehabilitative and Restorative Service Providers"

## 2016-06-10 DIAGNOSIS — R29898 Other symptoms and signs involving the musculoskeletal system: Secondary | ICD-10-CM | POA: Diagnosis not present

## 2016-06-10 DIAGNOSIS — R293 Abnormal posture: Secondary | ICD-10-CM | POA: Diagnosis not present

## 2016-06-10 DIAGNOSIS — M542 Cervicalgia: Secondary | ICD-10-CM | POA: Diagnosis not present

## 2016-06-10 NOTE — Therapy (Signed)
[place], [place], [address] Phone: [phone]   Fax:  [phone]  Physical Therapy Evaluation  Patient Details  Name: Desiree Rosario MRN: [id number] Date of Birth: October 30, 1955 Referring Provider: Dr Lynne Leader from Dr Marcial Pacas   Encounter Date: 06/10/2016      PT End of Session - 06/10/16 1548    Visit Number 1   Number of Visits 12   Date for PT Re-Evaluation 07/22/16   PT Start Time [id number]   PT Stop Time 1549   PT Time Calculation (min) 60 min   Activity Tolerance Patient tolerated treatment well      History reviewed. No pertinent past medical history.  Past Surgical History:  Procedure Laterality Date  . BREAST LUMPECTOMY    . LEG SURGERY     RT- fracture  . ROTATOR CUFF REPAIR Right     There were no vitals filed for this visit.       Subjective Assessment - 06/10/16 1449    Subjective Xola reports that she was shovelling gravel ~ 6 weeks ago and noticed pain and discomfort in the Lt cervical spine and Lt shoulder. She was treated with medication with some improvement. She has had MRI which did not show significant changes since 2007.    Pertinent History Lt cervical and shoulder pain 2007 received 2 spinal injections with good improvement; RCR one shoulder ~ 20 yrs ago; bilat lateral epicondlye releases ? dates; being treated for skin cancer currently   How long can you sit comfortably? 15-30 min    How long can you stand comfortably? no limit   How long can you walk comfortably? no limit   Diagnostic tests MRI    Patient Stated Goals get rid of pain and return to normal activities   Currently in Pain? Yes   Pain Score 2    Pain Location Neck   Pain Orientation Left   Pain Descriptors / Indicators Dull;Aching   Pain Type Acute pain   Pain Radiating Towards into the Lt shoudler area - sometimes up into the neck and head creating a HA    Pain Onset More than a month ago   Pain Frequency Constant   Aggravating Factors  head forward activities; head down; turning head to the sides; lifting Lt shoudler; lifting with Lt LE    Pain Relieving Factors meds for HA             [facility] PT Assessment - 06/10/16 0001      Assessment   Medical Diagnosis Cervical radiculitis    Referring Provider Dr Lynne Leader from Dr Marcial Pacas    Onset Date/Surgical Date 04/26/16   Hand Dominance Right   Next MD Visit as needed    Prior Therapy none this episode      Precautions   Precautions None   Precaution Comments being treated for skin cancer Lt shoulder area - hold modalites until dermatologist approves, patient to check at next visit     Balance Screen   Has the patient fallen in the past 6 months No   Has the patient had a decrease in activity level because of a fear of falling?  No   Is the patient reluctant to leave their home because of a fear of falling?  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 before she was walking on concrete for ~ 21 years    Leisure household chores; yard work - otherwise sedentary      Observation/Other Assessments   Focus on Therapeutic Outcomes (FOTO)  43% limitation     Sensation   Additional Comments tingling and numbness intermittently Lt UE shoulder to fingers daily 1-2 times lasting a few min and improves with movement      Posture/Postural Control   Posture Comments head forward; shoulders rounded and elevated; head of the humerus anterior in orientation; scapulae abducted and rotated along the thoracic wall      AROM   Right Shoulder Extension 46 Degrees  pain    Right Shoulder Flexion 148 Degrees   Right Shoulder ABduction 140 Degrees   Right Shoulder Internal Rotation 42 Degrees   Right Shoulder External Rotation 90 Degrees   Left Shoulder Extension 34 Degrees    Left Shoulder Flexion 143 Degrees   Left Shoulder ABduction 137 Degrees   Left Shoulder Internal Rotation 40 Degrees   Left Shoulder External Rotation 88 Degrees   Cervical Flexion 60   Cervical Extension 32   Cervical - Right Side Bend 34   Cervical - Left Side Bend 36   Cervical - Right Rotation 67   Cervical - Left Rotation 69     Strength   Overall Strength Comments 5/5 bilat UE's poor posterior girdle strength      Palpation   Spinal mobility tender and tight through cervical spine with CPA mobs Grade II/III    Palpation comment significant tightness noted through the ant/lat/post cervical musculature; pecs; upper trap; leveator; teres      Special Tests    Special Tests --  (+) neural tension test bilat UE's                    [facility] Adult PT Treatment/Exercise - 06/10/16 0001      Neuro Re-ed    Neuro Re-ed Details  working on posture and alignment      Neck Exercises: Standing   Neck Retraction 5 reps;10 secs     Neck Exercises: Supine   Neck Retraction 5 reps;10 secs   Other Supine Exercise neural stretch 1 min x 2 each side    Other Supine Exercise static snow angle 2-3 min as tolerated - arms ~ 70 deg abd      Shoulder Exercises: Stretch   Other Shoulder Stretches 3 way doorway 3 positions 30 sec x 2    Other Shoulder Stretches scap squeeze with noodle 10 sec x 10     Moist Heat Therapy   Number Minutes Moist Heat 15 Minutes   Moist Heat Location Cervical  thoracic spine                 PT Education - 06/10/16 1542    Education provided Yes   Education Details posture and alignment; HEP    Person(s) Educated Patient   Methods Explanation;Demonstration;Tactile cues;Verbal cues;Handout   Comprehension Verbalized understanding;Returned demonstration;Verbal cues required;Tactile cues required             PT Long Term Goals - 06/10/16 1559      PT LONG TERM GOAL #1   Title Improve posture and alignmnet with patient to demo  upright posture engaging posterior shoulder girdle musculature 07/22/16   Time 6   Period Weeks   Status New     PT  LONG TERM GOAL #2   Title Decrease muscular tightness to palpation through cervical and Shoulder girdle musculature 07/22/16   Time 6   Period Weeks   Status New     PT LONG TERM GOAL #3   Title Increase cervical and shoudler AROM by 3-10 degrees throughtout 07/22/16   Time 6   Period Weeks   Status New     PT LONG TERM GOAL #4   Title Independent in HEP 07/22/16   Time 6   Period Weeks   Status New     PT LONG TERM GOAL #5   Title Improve FOTO to </= 6% limitation 07/22/16   Time 6   Period Weeks   Status New               Plan - 06/10/16 1549    Clinical Impression Statement Patient presents with recurrent cervical pain and dysfunction with this episode resulting from shoveling gravel ~ 6 weeks ago. She has poor posture ana alignment; limited cervical and UE ROM; significant muscular tightness through the cervical and shoulder girdle areas; positive neural tension test; myofacial restrictions through the cervical and UE areas. She has intermittent radicular numbness and tingling in Lt UE as well as pain and discomfort limiting functional activity level.    Rehab Potential Good   PT Frequency 2x / week   PT Duration 6 weeks   PT Treatment/Interventions Patient/family education;ADLs/Self Care Home Management;Neuromuscular re-education;Cryotherapy;Electrical Stimulation;Iontophoresis 4mg /ml Dexamethasone;Moist Heat;Traction;Ultrasound;Dry needling;Manual techniques;Therapeutic activities;Therapeutic exercise  modalities as approved by dermatologist due to skin cancer treatment    PT Next Visit Plan manual work through the cervical and shoulder girdle area; postural correction; moist heat - hold on US/estim until dermatologist approves   Consulted and Agree with Plan of Care Patient      Patient will benefit from skilled therapeutic intervention in order  to improve the following deficits and impairments:  Postural dysfunction, Improper body mechanics, Pain, Increased fascial restricitons, Increased muscle spasms, Decreased range of motion, Decreased mobility, Decreased activity tolerance  Visit Diagnosis: Cervicalgia - Plan: PT plan of care cert/re-cert  Other symptoms and signs involving the musculoskeletal system - Plan: PT plan of care cert/re-cert  Abnormal posture - Plan: PT plan of care cert/re-cert     Problem List Patient Active Problem List   Diagnosis Date Noted  . Cervical radiculitis 05/25/2016  . Hyperlipidemia 11/23/2015  . 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 History of colonic polyps 11/20/2015  . Allergic rhinitis 11/13/2015  . Bilateral foot pain 06/13/2013    Celyn Nilda Simmer PT, MPH  06/10/2016, 4:06 PM  [facility] [place], [place], [address] Phone: [phone]   Fax:  [phone]  Name: Alohi Nilsson MRN: [id number] Date of Birth: 02-25-1956

## 2016-06-10 NOTE — Patient Instructions (Addendum)
Working on posture and alignment - lift chest working the muscles between your shoulder blades.   Axial Extension (Chin Tuck)    Pull chin in and lengthen back of neck. Hold _10___ seconds while counting out loud. Repeat __5-10 __ times. Do _several ___ sessions per day. Also do this lying down   Shoulder Blade Pinch    Pull arms back, pinching shoulder blades down and back . Hold __10__ seconds. Relax.  Can use swim noodle along spine Hold 10 sec Repeat _10___ times. Do __several __ sessions per day.  Scapula Adduction With Pectoralis Stretch: Low - Standing   Shoulders at 45 hands even with shoulders, keeping weight through legs, shift weight forward until you feel pull or stretch through the front of your chest. Hold _30__ seconds. Do _3__ times, _2-4__ times per day.   Scapula Adduction With Pectoralis Stretch: Mid-Range - Standing   Shoulders at 90 elbows even with shoulders, keeping weight through legs, shift weight forward until you feel pull or strength through the front of your chest. Hold __30_ seconds. Do _3__ times, __2-4_ times per day.   Scapula Adduction With Pectoralis Stretch: High - Standing   Shoulders at 120 hands up high on the doorway, keeping weight on feet, shift weight forward until you feel pull or stretch through the front of your chest. Hold _30__ seconds. Do _3__ times, _2-3__ times per day.   Neurovascular: Median Nerve Stretch - Supine    Lie with neck supported, side-bent away from moving arm. Hold right arm out to side, elbow bent, thumb down, fingers and wrist bent back. Slowly straighten elbowas far as possible without pain. Hold for __60__ seconds. Repeat __2__ times per set.  Do ____ sessions per day NO PAIN!   Snow angel - lying on back with hips and knees bent - arms at side in comfortable position palms up  Relax and rest arms at side for 3-5 minutes  Bend elbows to relax as needed

## 2016-06-22 ENCOUNTER — Ambulatory Visit (INDEPENDENT_AMBULATORY_CARE_PROVIDER_SITE_OTHER): Payer: BLUE CROSS/BLUE SHIELD | Admitting: Physical Therapy

## 2016-06-22 DIAGNOSIS — R29898 Other symptoms and signs involving the musculoskeletal system: Secondary | ICD-10-CM | POA: Diagnosis not present

## 2016-06-22 DIAGNOSIS — M542 Cervicalgia: Secondary | ICD-10-CM

## 2016-06-22 DIAGNOSIS — R293 Abnormal posture: Secondary | ICD-10-CM | POA: Diagnosis not present

## 2016-06-22 NOTE — Therapy (Signed)
[place], [place], [address] Phone: [phone]   Fax:  [phone]  Physical Therapy Treatment  Patient Details  Name: Kailie Polus MRN: [id number] Date of Birth: 23-Aug-1956 Referring Provider: Dr. Georgina Snell  Encounter Date: 06/22/2016      PT End of Session - 06/22/16 1522    Visit Number 2   Number of Visits 12   Date for PT Re-Evaluation 07/22/16   PT Start Time 1520   PT Stop Time 1618   PT Time Calculation (min) 58 min   Activity Tolerance Patient tolerated treatment well      No past medical history on file.  Past Surgical History:  Procedure Laterality Date  . BREAST LUMPECTOMY    . LEG SURGERY     RT- fracture  . ROTATOR CUFF REPAIR Right     There were no vitals filed for this visit.          [facility] PT Assessment - 06/22/16 0001      Assessment   Medical Diagnosis Cervical radiculitis    Referring Provider Dr. Georgina Snell   Onset Date/Surgical Date 04/26/16   Hand Dominance Right   Next MD Visit PRN     AROM   Right Shoulder Flexion 164 Degrees  supine    Left Shoulder Flexion 166 Degrees  supine          [facility] Adult PT Treatment/Exercise - 06/22/16 0001      Neck Exercises: Machines for Strengthening   UBE (Upper Arm Bike) L1:  1.25 min each way     Neck Exercises: Supine   Neck Retraction 10 reps;5 secs   Shoulder Flexion 10 reps  overhead pull, yellow band   Other Supine Exercise neural stretch 1 min x 2 each side    Other Supine Exercise snow angels x 10 reps.  Scap squeeze x 5 sec x 10 reps.  Shoulder diagonals with yellow band x 10 reps each arm;  horiz abdct with yellow band x 10 reps      Shoulder Exercises: Stretch   Other Shoulder Stretches 3 way doorway 3 positions 30 sec x 2      Moist Heat Therapy   Number Minutes Moist Heat 15 Minutes   Moist Heat Location Cervical  thoracic spine      Manual Therapy   Manual Therapy Myofascial  release;Manual Traction   Myofascial Release suboccipital release;  MFR to bilat upper trap and cervical paraspinals     Manual Traction 30 sec x 5 reps gentle cervical traction.             PT Long Term Goals - 06/22/16 1541      PT LONG TERM GOAL #1   Title Improve posture and alignment with patient to demo upright posture engaging posterior shoulder girdle musculature 07/22/16   Time 6   Period Weeks   Status On-going     PT LONG TERM GOAL #2   Title Decrease muscular tightness to palpation through cervical and Shoulder girdle musculature 07/22/16   Time 6   Period Weeks   Status On-going     PT LONG TERM GOAL #3   Title Increase cervical and shoudler AROM by 3-10 degrees throughout 07/22/16   Time 6   Period Weeks   Status On-going     PT LONG TERM GOAL #4   Title Independent in HEP 07/22/16   Time 6   Period Weeks   Status On-going  PT LONG TERM GOAL #5   Title Improve FOTO to </= 6% limitation 07/22/16   Time 6   Period Weeks   Status On-going               Plan - 06/22/16 1700    Clinical Impression Statement Pt tolerated all exercises well without increase in pain.  She continues with intermittent radicular symptoms in Lt UE, but not as often.  She required slight modification/ VC to improved form with existing exercises of HEP.   No new goals met; only 2nd visit.     Rehab Potential Good   PT Frequency 2x / week   PT Duration 6 weeks   PT Treatment/Interventions Patient/family education;ADLs/Self Care Home Management;Neuromuscular re-education;Cryotherapy;Electrical Stimulation;Iontophoresis 107m/ml Dexamethasone;Moist Heat;Traction;Ultrasound;Dry needling;Manual techniques;Therapeutic activities;Therapeutic exercise   PT Next Visit Plan Add scapular and postural strengthening exercises to HEP as tolerated.  Cont hold on modalities until Dermatologist approves.     Consulted and Agree with Plan of Care Patient      Patient will benefit from  skilled therapeutic intervention in order to improve the following deficits and impairments:  Postural dysfunction, Improper body mechanics, Pain, Increased fascial restricitons, Increased muscle spasms, Decreased range of motion, Decreased mobility, Decreased activity tolerance  Visit Diagnosis: Cervicalgia  Other symptoms and signs involving the musculoskeletal system  Abnormal posture     Problem List Patient Active Problem List   Diagnosis Date Noted  . Cervical radiculitis 05/25/2016  . Hyperlipidemia 11/23/2015  . 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 History of colonic polyps 11/20/2015  . Allergic rhinitis 11/13/2015  . Bilateral foot pain 06/13/2013   JKerin Perna PTA 06/22/16 5:07 PM  [id number] [address]Phone: [phone]  Fax:  [phone] Name: DYzabella CrunkMRN: [id number] of Birth: [birth year]

## 2016-06-23 ENCOUNTER — Encounter: Payer: Self-pay | Admitting: Physical Therapy

## 2016-06-29 ENCOUNTER — Ambulatory Visit (INDEPENDENT_AMBULATORY_CARE_PROVIDER_SITE_OTHER): Payer: BLUE CROSS/BLUE SHIELD | Admitting: Physical Therapy

## 2016-06-29 DIAGNOSIS — M542 Cervicalgia: Secondary | ICD-10-CM

## 2016-06-29 DIAGNOSIS — R293 Abnormal posture: Secondary | ICD-10-CM | POA: Diagnosis not present

## 2016-06-29 DIAGNOSIS — R29898 Other symptoms and signs involving the musculoskeletal system: Secondary | ICD-10-CM

## 2016-06-29 NOTE — Therapy (Signed)
[place], [place], [address] Phone: [phone]   Fax:  [phone]  Physical Therapy Treatment  Patient Details  Name: Desiree Rosario MRN: [id number] Date of Birth: 11-27-1955 Referring Provider: Dr. Georgina Snell   Encounter Date: 06/29/2016      PT End of Session - 06/29/16 1528    Visit Number 3   Number of Visits 12   Date for PT Re-Evaluation 07/22/16   PT Start Time 1519   PT Stop Time 1612   PT Time Calculation (min) 53 min   Activity Tolerance Patient tolerated treatment well      No past medical history on file.  Past Surgical History:  Procedure Laterality Date  . BREAST LUMPECTOMY    . LEG SURGERY     RT- fracture  . ROTATOR CUFF REPAIR Right     There were no vitals filed for this visit.      Subjective Assessment - 06/29/16 1521    Subjective Desiree Rosario states she had some additional surgery on her Lt shoulder for skin cancer. Pt states the MD approved estim on the shoulder, just avoiding bandaged area. She has been under the weather with a cold.     Currently in Pain? Yes   Pain Score 2    Pain Location Shoulder   Pain Orientation Left   Pain Descriptors / Indicators Sore   Aggravating Factors  forward head activities, turning head to sides, lifting heavy objects with LUE.    Pain Relieving Factors medication            [facility] PT Assessment - 06/29/16 0001      Assessment   Medical Diagnosis Cervical radiculitis    Referring Provider Dr. Georgina Snell    Onset Date/Surgical Date 04/26/16   Hand Dominance Right   Next MD Visit PRN     AROM   Cervical - Right Side Bend 40   Cervical - Left Side Bend 40           [facility] Adult PT Treatment/Exercise - 06/29/16 0001      Neck Exercises: Machines for Strengthening   UBE (Upper Arm Bike) L1:  1.5 min each way     Neck Exercises: Standing   Other Standing Exercises scap squeeze with noodle x 5 sec hold, 10 reps    Other  Standing Exercises shoulder ER with yellow band x 10 reps x 2 sets,  rowing with yellow band x 10, red band x 10;  shoulder ext with yellow band x 10, red band x 10      Neck Exercises: Supine   Other Supine Exercise --   Other Supine Exercise --     Shoulder Exercises: Stretch   Other Shoulder Stretches 3 way doorway 3 positions 30 sec x 2      Modalities   Modalities Electrical Stimulation     Moist Heat Therapy   Number Minutes Moist Heat 15 Minutes   Moist Heat Location Cervical;Shoulder     Electrical Stimulation   Electrical Stimulation Location Lt shoulder (posterior) and upper trap    Electrical Stimulation Action IFC   Electrical Stimulation Parameters  to tolerance, 15 min    Electrical Stimulation Goals Pain     Neck Exercises: Stretches   Upper Trapezius Stretch 2 reps;30 seconds   Levator Stretch 2 reps;30 seconds   Other Neck Stretches elbows on thighs in sitting, cervical flexion and diagonals.  PT Long Term Goals - 06/22/16 1541      PT LONG TERM GOAL #1   Title Improve posture and alignment with patient to demo upright posture engaging posterior shoulder girdle musculature 07/22/16   Time 6   Period Weeks   Status On-going     PT LONG TERM GOAL #2   Title Decrease muscular tightness to palpation through cervical and Shoulder girdle musculature 07/22/16   Time 6   Period Weeks   Status On-going     PT LONG TERM GOAL #3   Title Increase cervical and shoudler AROM by 3-10 degrees throughout 07/22/16   Time 6   Period Weeks   Status On-going     PT LONG TERM GOAL #4   Title Independent in HEP 07/22/16   Time 6   Period Weeks   Status On-going     PT LONG TERM GOAL #5   Title Improve FOTO to </= 6% limitation 07/22/16   Time 6   Period Weeks   Status On-going               Plan - 06/29/16 1604    Clinical Impression Statement Pt tolerated all exercises without increase in pain or radicular symptoms.  Pt demonstrated  slight improvement in cervical lateral flexion ROM this visit.  Progressing towards goals.    Rehab Potential Good   PT Frequency 2x / week   PT Duration 6 weeks   PT Treatment/Interventions Patient/family education;ADLs/Self Care Home Management;Neuromuscular re-education;Cryotherapy;Electrical Stimulation;Iontophoresis 4mg /ml Dexamethasone;Moist Heat;Traction;Ultrasound;Dry needling;Manual techniques;Therapeutic activities;Therapeutic exercise   PT Next Visit Plan Progress postural/ scapular strengthening as tolerated.     Consulted and Agree with Plan of Care Patient      Patient will benefit from skilled therapeutic intervention in order to improve the following deficits and impairments:  Postural dysfunction, Improper body mechanics, Pain, Increased fascial restricitons, Increased muscle spasms, Decreased range of motion, Decreased mobility, Decreased activity tolerance  Visit Diagnosis: Cervicalgia  Other symptoms and signs involving the musculoskeletal system  Abnormal posture     Problem List Patient Active Problem List   Diagnosis Date Noted  . Cervical radiculitis 05/25/2016  . Hyperlipidemia 11/23/2015  . 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 History of colonic polyps 11/20/2015  . Allergic rhinitis 11/13/2015  . Bilateral foot pain 06/13/2013   Kerin Perna, PTA 06/29/16 5:07 PM  [place], [place], [address] Phone: [phone]   Fax:  [phone]  Name: Desiree Rosario MRN: [id number] Date of Birth: [birth year]

## 2016-06-29 NOTE — Patient Instructions (Signed)
Resisted External Rotation: in Neutral - Bilateral   PALMS UP Sit or stand, tubing in both hands, elbows at sides, bent to 90, forearms forward. Pinch shoulder blades together and rotate forearms out. Keep elbows at sides. Repeat __10__ times per set. Do _2-3___ sets per session. Do _2-3___ sessions per week.   Low Row: Standing   Face anchor, feet shoulder width apart. Palms up, pull arms back, squeezing shoulder blades together. Repeat 10__ times per set. Do 2-3__ sets per session. Do 2-3__ sessions per week. Anchor Height: Waist  Strengthening: Resisted Extension   Hold tubing in right hand, arm forward. Pull arm back, elbow straight. Repeat _10___ times per set. Do 2-3____ sets per session. Do 2-3____ sessions per week.   [facility] [address] [place], [place] [address]  [phone] (office) [phone] (fax)  

## 2016-07-01 ENCOUNTER — Ambulatory Visit (INDEPENDENT_AMBULATORY_CARE_PROVIDER_SITE_OTHER): Payer: BLUE CROSS/BLUE SHIELD | Admitting: Physical Therapy

## 2016-07-01 DIAGNOSIS — R29898 Other symptoms and signs involving the musculoskeletal system: Secondary | ICD-10-CM

## 2016-07-01 DIAGNOSIS — R293 Abnormal posture: Secondary | ICD-10-CM

## 2016-07-01 DIAGNOSIS — M542 Cervicalgia: Secondary | ICD-10-CM

## 2016-07-01 NOTE — Therapy (Signed)
[place], [place], [address] Phone: [phone]   Fax:  [phone]  Physical Therapy Treatment  Patient Details  Name: Desiree Rosario MRN: [id number] Date of Birth: January 24, 1956 Referring Provider: Dr. Georgina Snell  Encounter Date: 07/01/2016      PT End of Session - 07/01/16 1539    Visit Number 4   Number of Visits 12   Date for PT Re-Evaluation 07/22/16   PT Start Time 4656   PT Stop Time 1618   PT Time Calculation (min) 44 min   Activity Tolerance Patient tolerated treatment well;No increased pain      No past medical history on file.  Past Surgical History:  Procedure Laterality Date  . BREAST LUMPECTOMY    . LEG SURGERY     RT- fracture  . ROTATOR CUFF REPAIR Right     There were no vitals filed for this visit.      Subjective Assessment - 07/01/16 1536    Subjective Pt reports she only has pain in side of Lt shoulder with certain activities, not all the time.  (ie: reaching in back seat).  Not sure that estim helped much last session.  She has been compliant with HEP.     Pertinent History Lt cervical and shoulder pain 2007 received 2 spinal injections with good improvement; RCR one shoulder ~ 20 yrs ago; bilat lateral epicondlye releases ? dates; being treated for skin cancer currently   Patient Stated Goals get rid of pain and return to normal activities   Currently in Pain? No/denies   Pain Score 0-No pain   Pain Location Shoulder   Pain Orientation Left            [facility] PT Assessment - 07/01/16 0001      Assessment   Medical Diagnosis Cervical radiculitis    Referring Provider Dr. Georgina Snell   Onset Date/Surgical Date 04/26/16   Hand Dominance Right     AROM   Right Shoulder Extension 57 Degrees   Right Shoulder Flexion 162 Degrees   Right Shoulder ABduction 158 Degrees   Right Shoulder Internal Rotation 46 Degrees   Right Shoulder External Rotation 92 Degrees   Left  Shoulder Extension 50 Degrees   Left Shoulder Flexion 152 Degrees   Left Shoulder ABduction 154 Degrees   Left Shoulder Internal Rotation 45 Degrees   Left Shoulder External Rotation 91 Degrees   Cervical - Right Side Bend 40   Cervical - Left Side Bend 40   Cervical - Right Rotation 74   Cervical - Left Rotation 72          [facility] Adult PT Treatment/Exercise - 07/01/16 0001      Neck Exercises: Machines for Strengthening   UBE (Upper Arm Bike) L2:  2 min each way     Neck Exercises: Standing   Other Standing Exercises Over head pull with yellow band    Other Standing Exercises Sash with yellow band x 10 reps each side (back against pool noodle)      Shoulder Exercises: Standing   External Rotation Strengthening;Both;10 reps;Theraband   Theraband Level (Shoulder External Rotation) Level 1 (Yellow)   Extension Both;20 reps;Theraband   Theraband Level (Shoulder Extension) Level 1 (Yellow)   Row Both;20 reps;Theraband   Theraband Level (Shoulder Row) Level 1 (Yellow)     Shoulder Exercises: Stretch   Other Shoulder Stretches 3 way doorway 3 positions 30 sec x 2      Modalities  Modalities Moist Heat     Moist Heat Therapy   Number Minutes Moist Heat 12 Minutes   Moist Heat Location Shoulder;Cervical                PT Education - 07/01/16 1608    Education provided Yes   Education Details HEP - added sash and overhead pull exercise.    Person(s) Educated Patient   Methods Explanation;Handout;Demonstration   Comprehension Verbalized understanding;Returned demonstration             PT Long Term Goals - 07/01/16 1541      PT LONG TERM GOAL #1   Title Improve posture and alignment with patient to demo upright posture engaging posterior shoulder girdle musculature 07/22/16   Time 6               Plan - 07/01/16 1611    Clinical Impression Statement Pt demonstrated improved shoulder and cervical ROM; has met LTG # 3.  She tolerated all exercises  well, with minimal to no increase in pain.  Pt making great progress towards remaining goals, may be near d/c.     Rehab Potential Good   PT Frequency 2x / week   PT Duration 6 weeks   PT Treatment/Interventions Patient/family education;ADLs/Self Care Home Management;Neuromuscular re-education;Cryotherapy;Electrical Stimulation;Iontophoresis 59m/ml Dexamethasone;Moist Heat;Traction;Ultrasound;Dry needling;Manual techniques;Therapeutic activities;Therapeutic exercise   PT Next Visit Plan Progress postural/ scapular strengthening as tolerated.  Assess readiness for d/c to HEP.    Consulted and Agree with Plan of Care Patient      Patient will benefit from skilled therapeutic intervention in order to improve the following deficits and impairments:  Postural dysfunction, Improper body mechanics, Pain, Increased fascial restricitons, Increased muscle spasms, Decreased range of motion, Decreased mobility, Decreased activity tolerance  Visit Diagnosis: Cervicalgia  Other symptoms and signs involving the musculoskeletal system  Abnormal posture     Problem List Patient Active Problem List   Diagnosis Date Noted  . Cervical radiculitis 05/25/2016  . Hyperlipidemia 11/23/2015  . 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 History of colonic polyps 11/20/2015  . Allergic rhinitis 11/13/2015  . Bilateral foot pain 06/13/2013   JKerin Perna PTA 07/01/16 4:15 PM  [facility] [address]Phone: [phone]  Fax:  [phone] Name: Desiree TurneyMRN: [id number] of Birth: 705-18-1957

## 2016-07-01 NOTE — Patient Instructions (Signed)
Over Head Pull: Narrow Grip     [place] [phone]   On back, knees bent, feet flat, band across thighs, elbows straight but relaxed. Pull hands apart (start). Keeping elbows straight, bring arms up and over head, hands toward floor. Keep pull steady on band. Hold momentarily. Return slowly, keeping pull steady, back to start. Repeat _10__ times. Band color __yellow or red ____   Side Pull: Double Arm   On back, knees bent, feet flat. Arms perpendicular to body, shoulder level, elbows straight but relaxed. Pull arms out to sides, elbows straight. Resistance band comes across collarbones, hands toward floor. Hold momentarily. Slowly return to starting position. Repeat _10__ times. Band color __yellow or red ___   Sash   On back, knees bent, feet flat, left hand on left hip, right hand above left. Pull right arm DIAGONALLY (hip to shoulder) across chest. Bring right arm along head toward floor. Hold momentarily. Slowly return to starting position. Repeat _10__ times. Do with left arm. 2 sets. Band color __yellow or red___

## 2016-07-07 ENCOUNTER — Ambulatory Visit (INDEPENDENT_AMBULATORY_CARE_PROVIDER_SITE_OTHER): Payer: BLUE CROSS/BLUE SHIELD | Admitting: Physical Therapy

## 2016-07-07 DIAGNOSIS — R29898 Other symptoms and signs involving the musculoskeletal system: Secondary | ICD-10-CM | POA: Diagnosis not present

## 2016-07-07 DIAGNOSIS — M542 Cervicalgia: Secondary | ICD-10-CM | POA: Diagnosis not present

## 2016-07-07 DIAGNOSIS — R293 Abnormal posture: Secondary | ICD-10-CM | POA: Diagnosis not present

## 2016-07-07 NOTE — Therapy (Addendum)
[place]  [place], [place], [address] Phone: [phone]   Fax:  [phone]  Physical Therapy Treatment  Patient Details  Name: Desiree Rosario MRN: [id number] Date of Birth: Dec 01, 1955 Referring Provider: Dr. Georgina Snell   Encounter Date: 07/07/2016      PT End of Session - 07/07/16 1457    Visit Number 5   Number of Visits 12   Date for PT Re-Evaluation 07/22/16   PT Start Time 8250  pt brought back late   PT Stop Time 1539   PT Time Calculation (min) 45 min   Activity Tolerance Patient tolerated treatment well      No past medical history on file.  Past Surgical History:  Procedure Laterality Date  . BREAST LUMPECTOMY    . LEG SURGERY     RT- fracture  . ROTATOR CUFF REPAIR Right     There were no vitals filed for this visit.      Subjective Assessment - 07/07/16 1458    Subjective "I think it is just going to take time" to resolve and get better.  She continues to perform her HEP 1x/day.  She notices if she does a lot of forward head posture at work, her shoulder starts to bother her; resolves with stretches and time.    Currently in Pain? No/denies   Pain Score 0-No pain   Aggravating Factors  forward head activities.             [facility] PT Assessment - 07/07/16 0001      Assessment   Medical Diagnosis Cervical radiculitis    Referring Provider Dr. Georgina Snell    Onset Date/Surgical Date 04/26/16   Hand Dominance Right     Observation/Other Assessments   Focus on Therapeutic Outcomes (FOTO)  30% limited            [facility] Adult PT Treatment/Exercise - 07/07/16 0001      Neck Exercises: Standing   Other Standing Exercises Sash with red band x 10 reps each side (mirror for visual feedback)     Neck Exercises: Prone   W Back 10 reps  with axial extension   Shoulder Extension 5 reps  with axial extension    Other Prone Exercise shoulder flexion with axial ext x 5 reps each side      Shoulder Exercises: Standing   Extension Strengthening;Both   Theraband Level (Shoulder Extension) Level 2 (Red)   Row Strengthening;Both;10 reps;Theraband   Theraband Level (Shoulder Row) Level 2 (Red);Level 3 (Green)  10 reps each red, then green.     Shoulder Exercises: Stretch   Cross Chest Stretch 2 reps;30 seconds   Other Shoulder Stretches 3 way doorway 3 positions 30 sec x 2      Modalities   Modalities Moist Heat     Moist Heat Therapy   Number Minutes Moist Heat 12 Minutes   Moist Heat Location Shoulder;Cervical     Neck Exercises: Stretches   Upper Trapezius Stretch 2 reps;30 seconds   Levator Stretch 2 reps;30 seconds                PT Education - 07/07/16 1715    Education provided Yes   Education Details HEP - added prone scap exercise   Person(s) Educated Patient   Methods Explanation;Handout;Tactile cues;Demonstration   Comprehension Returned demonstration;Verbalized understanding             PT Long Term Goals - 07/07/16 1503  PT LONG TERM GOAL #1   Title Improve posture and alignment with patient to demo upright posture engaging posterior shoulder girdle musculature 07/22/16   Time 6   Period Weeks   Status Achieved     PT LONG TERM GOAL #2   Title Decrease muscular tightness to palpation through cervical and Shoulder girdle musculature 07/22/16   Time 6   Period Weeks   Status On-going     PT LONG TERM GOAL #3   Title Increase cervical and shoulder AROM by 3-10 degrees throughout 07/22/16   Time 6   Period Weeks   Status Achieved     PT LONG TERM GOAL #4   Title Independent in HEP 07/22/16   Time 6   Period Weeks   Status On-going     PT LONG TERM GOAL #5   Title Improve FOTO to </= 34% limitation 07/22/16   Time 6   Period Weeks   Status Achieved  scored 30%               Plan - 07/07/16 1508    Clinical Impression Statement Pt has met LTG #1 with improved posture.  Pt tolerated all exercises well, some  with increased resistance - and no increase in pain. Pt requests to hold therapy and continue work on HEP.     Rehab Potential Good   PT Frequency 2x / week   PT Duration 6 weeks   PT Treatment/Interventions Patient/family education;ADLs/Self Care Home Management;Neuromuscular re-education;Cryotherapy;Electrical Stimulation;Iontophoresis 29m/ml Dexamethasone;Moist Heat;Traction;Ultrasound;Dry needling;Manual techniques;Therapeutic activities;Therapeutic exercise   PT Next Visit Plan Spoke to supervising PT.  Will hold therapy for 2 weeks.  If pt doesn't return before then, will d/c to HEP.  If pt returns will continue postural strengthening.    Consulted and Agree with Plan of Care Patient      Patient will benefit from skilled therapeutic intervention in order to improve the following deficits and impairments:  Postural dysfunction, Improper body mechanics, Pain, Increased fascial restricitons, Increased muscle spasms, Decreased range of motion, Decreased mobility, Decreased activity tolerance  Visit Diagnosis: Cervicalgia  Other symptoms and signs involving the musculoskeletal system  Abnormal posture     Problem List Patient Active Problem List   Diagnosis Date Noted  . Cervical radiculitis 05/25/2016  . Hyperlipidemia 11/23/2015  . 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 History of colonic polyps 11/20/2015  . Allergic rhinitis 11/13/2015  . Bilateral foot pain 06/13/2013   JKerin Perna PTA 07/07/16 5:16 PM  [place] [place] [address]Phone: [phone]  Fax:  [phone] Name: Desiree BriddellMRN: [id number] of Birth: 712-26-1957 PHYSICAL THERAPY DISCHARGE SUMMARY  Visits from Start of Care: 5  Current functional level related to goals / functional outcomes: See PT progress note for discharge status.    Remaining deficits: See note   Education /  Equipment: HEP Plan: Patient agrees to discharge.  Patient goals were partially met. Patient is being discharged due to not returning since the last visit.  ?????     Celyn P. HHelene KelpPT, MPH 08/24/16 3:22 PM

## 2016-07-07 NOTE — Patient Instructions (Addendum)
Scapular Retraction (Prone)    Lie with arms at sides. Pinch shoulder blades together and raise arms a few inches from floor. Lift forehead off of towel 1-2 "  Repeat __5-10__ times per set. Do __2__ sets per session. Do __3__ sessions per week.   Scapular Retraction: Abduction (Prone)    Lie with upper arms straight out from sides, elbows bent to 90. Pinch shoulder blades together and raise arms a few inches from floor.  Lift head off of towel (1-2")  Repeat __5-10__ times per set. Do __2__ sets per session. Do __2-3__ sessions per week.   [facility] [address] [place], [place] [address]  [phone] (office) [phone] (fax)

## 2016-07-11 ENCOUNTER — Encounter: Payer: BLUE CROSS/BLUE SHIELD | Admitting: Rehabilitative and Restorative Service Providers"

## 2016-07-14 ENCOUNTER — Encounter: Payer: BLUE CROSS/BLUE SHIELD | Admitting: Physical Therapy

## 2016-09-26 HISTORY — PX: FACET JOINT INJECTION: SHX5016

## 2016-11-28 ENCOUNTER — Encounter: Payer: Self-pay | Admitting: Physician Assistant

## 2016-11-28 ENCOUNTER — Ambulatory Visit (INDEPENDENT_AMBULATORY_CARE_PROVIDER_SITE_OTHER): Payer: BLUE CROSS/BLUE SHIELD | Admitting: Physician Assistant

## 2016-11-28 VITALS — BP 122/80 | HR 67 | Wt 175.0 lb

## 2016-11-28 DIAGNOSIS — M5090 Cervical disc disorder, unspecified, unspecified cervical region: Secondary | ICD-10-CM | POA: Diagnosis not present

## 2016-11-28 DIAGNOSIS — Z13820 Encounter for screening for osteoporosis: Secondary | ICD-10-CM

## 2016-11-28 MED ORDER — PREDNISONE 50 MG PO TABS: ORAL_TABLET | ORAL | 0 refills | Status: DC

## 2016-11-28 MED ORDER — MELOXICAM 15 MG PO TABS: 15.0000 mg | ORAL_TABLET | Freq: Every day | ORAL | 0 refills | Status: DC

## 2016-11-28 NOTE — Patient Instructions (Signed)
Prednisone 1 tab (50mg ) daily x 5 days Then Meloxicam 1 tab (15mg ) daily with food. Do not combine with any other OTC pain relievers (no Ibuprofen, Aleve, Motrin, Advil, naproxen). Tylenol is Ok Ice/Heat x 15 minutes every 2 hours Self-massage TENS unit - TENS 7000 [email or web address]   Follow-up with Dr. Darene Lamer (sports Medicine) if no improvement in 1 month

## 2016-11-28 NOTE — Progress Notes (Signed)
HPI:                                                                Desiree Rosario is a 61 y.o. female who presents to [place]: [place] today for neck pain  Patient with history of cervical DDD and radiculopathy reports left-sided neck pain x 1 week. She states it feels like she has "a crick in her neck" and endorses limited range of motion on that side. Denies paresthesias or weakness. Denies injury or trauma. She has only tried [place].  Health Maintenance Health Maintenance  Topic Date Due  . Hepatitis C Screening  08/31/56  . HIV Screening  04/07/1971  . TETANUS/TDAP  04/07/1975  . PAP SMEAR  04/06/1977  . COLONOSCOPY  04/06/2006  . MAMMOGRAM  03/28/2018  . INFLUENZA VACCINE  Completed    Past Medical History:  Diagnosis Date  . Allergy   . GERD (gastroesophageal reflux disease)   . History of colon polyps   . Hyperlipidemia   . IBS (irritable bowel syndrome)   . SCC (squamous cell carcinoma), face    Past Surgical History:  Procedure Laterality Date  . BREAST LUMPECTOMY    . LEG SURGERY     RT- fracture  . ROTATOR CUFF REPAIR Right    Social History  Substance Use Topics  . Smoking status: Never Smoker  . Smokeless tobacco: Not on file  . Alcohol use No   family history includes Cancer in her mother; Diabetes in her father; Stroke in her maternal grandmother.  ROS: negative except as noted in the HPI  Medications: Current Outpatient Prescriptions  Medication Sig Dispense Refill  . Estradiol-Norgestimate (PREFEST PO) Take by mouth daily.    . pantoprazole (PROTONIX) 40 MG tablet Take 40 mg by mouth daily.     No current facility-administered medications for this visit.    No Known Allergies     Objective:  BP 122/80   Pulse 67   Wt 175 lb (79.4 kg)   BMI 29.57 kg/m  Gen: well-groomed, cooperative, not ill-appearing, no distress Neck: palpable spasm of left trapezius, limited ROM with left lateral  flexion and rotation, no spinous process tenderness, no step-off deformity Extremities: strength 5/5/ and symmetric, normal gait and station Psych: normal affect, pleasant mood, normal speech and thought content  CLINICAL DATA:  Cervical radiculitis bilateral. Neck pain with bilateral arm pain  EXAM: MRI CERVICAL SPINE WITHOUT CONTRAST  TECHNIQUE: Multiplanar, multisequence MR imaging of the cervical spine was performed. No intravenous contrast was administered.  COMPARISON:  Cervical spine radiographs 05/25/2016  FINDINGS: Alignment: 1 mm anterior listhesis C4-5. Remaining alignment normal. Straightening of the cervical lordosis.  Vertebrae: Negative for fracture or mass.  Normal bone marrow.  Cord: Normal spinal cord  Posterior Fossa, vertebral arteries, paraspinal tissues: Negative  Disc levels:  C2-3:  Negative  C3-4: Mild uncinate spurring on the right without significant spinal or foraminal stenosis  C4-5: 1 mm anterior listhesis. Negative for spinal or foraminal stenosis. Facet degeneration left greater than right.  C5-6: Mild disc degeneration and disc bulging without spinal or foraminal stenosis  C6-7:  Mild disc bulging without spinal or foraminal stenosis  C7-T1:  Negative  IMPRESSION: Mild degenerative changes in the cervical spine.  Negative for stenosis or neural impingement. Mild anterior listhesis C4-5.   Electronically Signed   By: Franchot Gallo M.D.   On: 05/31/2016 15:56  I personally reviewed MRI report from 05/2016 Assessment and Plan: 61 y.o. female with   1. Cervical disc disease - predniSONE (DELTASONE) 50 MG tablet; One tab PO daily for 5 days.  Dispense: 5 tablet; Refill: 0 - meloxicam (MOBIC) 15 MG tablet; Take 1 tablet (15 mg total) by mouth daily.  Dispense: 30 tablet; Refill: 0 - Ambulatory referral to Physical Therapy  Prednisone 1 tab (50mg ) daily x 5 days Then Meloxicam 1 tab (15mg ) daily with food. Do not  combine with any other OTC pain relievers (no Ibuprofen, Aleve, Motrin, Advil, naproxen). Tylenol is Ok Ice/Heat x 15 minutes every 2 hours Self-massage TENS unit - TENS 7000 Follow-up with Dr. Darene Lamer (sports Medicine) if no improvement in 1 month  2. Osteoporosis screening - DG Bone Density  Patient education and anticipatory guidance given Patient agrees with treatment plan Follow-up in 6 months for annual physical or sooner as needed  Darlyne Russian PA-C

## 2016-12-02 DIAGNOSIS — M47812 Spondylosis without myelopathy or radiculopathy, cervical region: Secondary | ICD-10-CM | POA: Insufficient documentation

## 2016-12-06 ENCOUNTER — Other Ambulatory Visit: Payer: BLUE CROSS/BLUE SHIELD

## 2016-12-13 ENCOUNTER — Other Ambulatory Visit: Payer: BLUE CROSS/BLUE SHIELD

## 2017-02-09 ENCOUNTER — Ambulatory Visit (INDEPENDENT_AMBULATORY_CARE_PROVIDER_SITE_OTHER): Payer: BLUE CROSS/BLUE SHIELD | Admitting: Physician Assistant

## 2017-02-09 ENCOUNTER — Ambulatory Visit (INDEPENDENT_AMBULATORY_CARE_PROVIDER_SITE_OTHER): Payer: BLUE CROSS/BLUE SHIELD

## 2017-02-09 VITALS — BP 132/83 | HR 61 | Wt 178.0 lb

## 2017-02-09 DIAGNOSIS — M533 Sacrococcygeal disorders, not elsewhere classified: Secondary | ICD-10-CM

## 2017-02-09 DIAGNOSIS — R195 Other fecal abnormalities: Secondary | ICD-10-CM

## 2017-02-09 DIAGNOSIS — K582 Mixed irritable bowel syndrome: Secondary | ICD-10-CM

## 2017-02-09 LAB — CBC
HCT: 41 % (ref 35.0–45.0)
HEMOGLOBIN: 13.1 g/dL (ref 11.7–15.5)
MCH: 29 pg (ref 27.0–33.0)
MCHC: 32 g/dL (ref 32.0–36.0)
MCV: 90.7 fL (ref 80.0–100.0)
MPV: 10.9 fL (ref 7.5–12.5)
PLATELETS: 252 10*3/uL (ref 140–400)
RBC: 4.52 MIL/uL (ref 3.80–5.10)
RDW: 13.4 % (ref 11.0–15.0)
WBC: 5.6 10*3/uL (ref 3.8–10.8)

## 2017-02-09 MED ORDER — DICYCLOMINE HCL 10 MG PO CAPS: 10.0000 mg | ORAL_CAPSULE | Freq: Three times a day (TID) | ORAL | 1 refills | Status: DC

## 2017-02-09 NOTE — Patient Instructions (Addendum)
-   Stop Meloxicam and all over-the-counter pain relievers except Tylenol (No NSAIDs such as Aspirin Ibuprofen, Motrin, Aleve, etc.) - Start daily Probiotic (such as Culturelle) - Take Bentyl three times daily before meals for IBS - Avoid gas-producing foods ((eg, beans, onions, celery, carrots, raisins, bananas, apricots, prunes, Brussels sprouts, wheat germ, pretzels, and bagels) - Eat small, regular meals - Limit alcohol, and caffeine - Follow-up with your GI doctor  For neck pain: Ice/heat: 20 mins, 3 times per day TENS unit Rehab exercises 1-2 times daily

## 2017-02-09 NOTE — Progress Notes (Signed)
HPI:                                                                Desiree Rosario is a 61 y.o. female who presents to [place]: [place] today for back pain  Back Pain  This is a recurrent problem. The current episode started in the past 7 days. The problem occurs constantly. The problem is unchanged. The pain is present in the lumbar spine and sacro-iliac. The quality of the pain is described as aching. The pain does not radiate. The pain is moderate. The pain is worse during the day. The symptoms are aggravated by sitting. Pertinent negatives include no bladder incontinence, bowel incontinence, leg pain, numbness, paresis, paresthesias or weakness. Risk factors include menopause and sedentary lifestyle (No history of injury or trauma). She has tried nothing for the symptoms.   Patient also reports that she has had abdominal bloating and frequent loose stools. She also endorses at least one episode of melena. She has also noted mucus in her stool when she wipes. She denies fever, chills, weight loss, nausea, vomiting, abdominal pain, tenesmus, or hematochezia. She has a history of IBS and GERD for which she takes Protonix daily. She was recently prescribed Meloxicam in March for cervical radiculopathy. She has also been taking OTC Pepto Bismol. She denies any history of peptic ulcers. Patient reports she had a colonoscopy last year that did show polyps, but was otherwise normal.  Past Medical History:  Diagnosis Date  . Allergy   . GERD (gastroesophageal reflux disease)   . History of colon polyps   . Hyperlipidemia   . IBS (irritable bowel syndrome)   . SCC (squamous cell carcinoma), face    Past Surgical History:  Procedure Laterality Date  . BREAST LUMPECTOMY     fibroadenomas  . LEG SURGERY     RT- fracture  . ROTATOR CUFF REPAIR Right    Social History  Substance Use Topics  . Smoking status: Never Smoker  . Smokeless tobacco: Not on  file  . Alcohol use No   family history includes Cancer in her mother; Diabetes in her father; Stroke in her maternal grandmother.  ROS: negative except as noted in the HPI  Medications: Current Outpatient Prescriptions  Medication Sig Dispense Refill  . Estradiol-Norgestimate (PREFEST PO) Take by mouth daily.    . pantoprazole (PROTONIX) 40 MG tablet Take 40 mg by mouth daily.    . meloxicam (MOBIC) 15 MG tablet Take 1 tablet (15 mg total) by mouth daily. (Patient not taking: Reported on 02/09/2017) 30 tablet 0   No current facility-administered medications for this visit.    No Known Allergies  Objective:  BP 132/83   Pulse 61   Wt 178 lb (80.7 kg)   BMI 30.08 kg/m  Gen: well-groomed, cooperative, not ill-appearing, no distress HEENT: normal conjunctiva, no icterus, oropharynx clear, moist mucus membranes, neck supple, trachea midline Pulm: Normal work of breathing, normal phonation, clear to auscultation bilaterally, no wheezes, rales or rhonchi CV: Normal rate, regular rhythm, s1 and s2 distinct, no murmurs, clicks or rubs  GI: bowel sounds hyperactive, abdomen obese, soft, nondistended, nontender, no hepatomegaly, no masses, negative Murphy's sign; rectal exam - skin tag present at the 10 o'  clock position, no hemorrhoids, Guaiac positive stool Neuro: alert and oriented x 3, EOM's intact, no tremor MSK: back atraumatic, no spinous process tenderness, low back tender bilaterally and over the sacrum, negative straight leg raise, moving all extremities, normal gait and station, no peripheral edema Skin: warm, dry, intact; no rashes or lesions on exposed skin, no jaundice  A chaperone was used for the rectal portion of the exam Izell Greene, CMA)  Assessment and Plan: 61 y.o. female with   1. Irritable bowel syndrome with both constipation and diarrhea - daily Probiotic and Bentyl tid before meals - avoid gas-producing foods; limit caffeine and alcohol - eat small, frequent  meals - dicyclomine (BENTYL) 10 MG capsule; Take 1 capsule (10 mg total) by mouth 3 (three) times daily before meals.  Dispense: 90 capsule; Refill: 1  2. Heme positive stool - hemodynamically stable - differential includes peptic/duodenal ulcer, gastritis, esophagitis. Avoiding all NSAIDs and checking labs - CBC - Comprehensive metabolic panel - Protime-INR - APTT - Ambulatory referral to Gastroenterology for close follow-up and possible EGD  3. Sacral back pain - DG Sacrum/Coccyx; Future per patient's request - Avoiding all NSAIDs given heme positive stool and cannot rule out PUD - Conservative treatment with Ice/Heat, TENS unit, Rehab exercises   Patient education and anticipatory guidance given Patient agrees with treatment plan Follow-up as needed if symptoms worsen or fail to improve  Darlyne Russian PA-C

## 2017-02-10 ENCOUNTER — Encounter: Payer: Self-pay | Admitting: Physician Assistant

## 2017-02-10 DIAGNOSIS — M533 Sacrococcygeal disorders, not elsewhere classified: Secondary | ICD-10-CM | POA: Insufficient documentation

## 2017-02-10 DIAGNOSIS — D126 Benign neoplasm of colon, unspecified: Secondary | ICD-10-CM | POA: Insufficient documentation

## 2017-02-10 DIAGNOSIS — K582 Mixed irritable bowel syndrome: Secondary | ICD-10-CM | POA: Insufficient documentation

## 2017-02-10 LAB — APTT: aPTT: 28 s (ref 22–34)

## 2017-02-10 LAB — COMPREHENSIVE METABOLIC PANEL
ALBUMIN: 4.3 g/dL (ref 3.6–5.1)
ALT: 14 U/L (ref 6–29)
AST: 17 U/L (ref 10–35)
Alkaline Phosphatase: 118 U/L (ref 33–130)
BILIRUBIN TOTAL: 0.4 mg/dL (ref 0.2–1.2)
BUN: 13 mg/dL (ref 7–25)
CO2: 26 mmol/L (ref 20–31)
CREATININE: 0.94 mg/dL (ref 0.50–0.99)
Calcium: 9.5 mg/dL (ref 8.6–10.4)
Chloride: 103 mmol/L (ref 98–110)
Glucose, Bld: 95 mg/dL (ref 65–99)
Potassium: 4.1 mmol/L (ref 3.5–5.3)
SODIUM: 140 mmol/L (ref 135–146)
Total Protein: 6.8 g/dL (ref 6.1–8.1)

## 2017-02-10 LAB — PROTIME-INR
INR: 0.9
Prothrombin Time: 10 s (ref 9.0–11.5)

## 2017-03-06 ENCOUNTER — Ambulatory Visit (INDEPENDENT_AMBULATORY_CARE_PROVIDER_SITE_OTHER): Payer: BLUE CROSS/BLUE SHIELD | Admitting: Sports Medicine

## 2017-03-06 DIAGNOSIS — M47812 Spondylosis without myelopathy or radiculopathy, cervical region: Secondary | ICD-10-CM | POA: Diagnosis not present

## 2017-03-06 DIAGNOSIS — M778 Other enthesopathies, not elsewhere classified: Secondary | ICD-10-CM | POA: Diagnosis not present

## 2017-03-06 DIAGNOSIS — M779 Enthesopathy, unspecified: Secondary | ICD-10-CM

## 2017-03-06 MED ORDER — MELOXICAM 15 MG PO TABS: ORAL_TABLET | ORAL | 3 refills | Status: DC

## 2017-03-06 NOTE — Assessment & Plan Note (Signed)
Left flexor tenosynovitis of the second digit, there is mild palpable triggering. Injection as above. Return in one month.

## 2017-03-06 NOTE — Progress Notes (Signed)
Subjective:    I'm seeing this patient as a consultation for:  Desiree Rosario  CC: left-sided neck pain, left hand pain  HPI: Desiree Rosario is a 61 y.o. Female with a history of cervical DDD who presents with left-sided neck pain, left hand pain.   She has had chronic neck pain for years, and has done physical therapy and meloxicam in the past. She had an epidural injection in her neck about 10 years ago that seemed to help back then. The neck pain worsened about a month ago, and she presented to clinic, but because she also had heme positive stools at that time, NSAIDs were avoided until she could see GI. She has been taking tylenol which seems to help somewhat. The pain is worse when she wakes up in the morning, and is worse when she turns her head to the left. She reports that formal PT was not that effective, so she would prefer home exercises.  Her left hand pain started a week or so ago in the base of her second finger. The pain/stiffness is worse in the morning after she wakes up, but loosens up on moving it around. She has noticed some swelling on the palmar aspect of the base of the second finger. She has not had anything like this before.  Past medical history:  Negative.  See flowsheet/record as well for more information.  Surgical history: Negative.  See flowsheet/record as well for more information.  Family history: Negative.  See flowsheet/record as well for more information.  Social history: Negative.  See flowsheet/record as well for more information.  Allergies, and medications have been entered into the medical record, reviewed, and no changes needed.   Review of Systems: No headache, visual changes, nausea, vomiting, diarrhea, constipation, dizziness, abdominal pain, skin rash, fevers, chills, night sweats, weight loss, swollen lymph nodes, body aches, joint swelling, muscle aches, chest pain, shortness of breath, mood changes, visual or auditory hallucinations.    Objective:   General: Well Developed, well nourished, and in no acute distress.  Neuro/Psych: Alert and oriented x3, extra-ocular muscles intact, able to move all 4 extremities, sensation grossly intact. Skin: Warm and dry, no rashes noted.  Respiratory: Not using accessory muscles, speaking in full sentences, trachea midline.  Cardiovascular: Pulses palpable, no extremity edema. Abdomen: Does not appear distended. Neck: Inspection unremarkable. No palpable stepoffs. Painful ROM on left neck turn, left neck bend, normal right neck turn, right neck bend, neck flexion, extension. Strength good C4 to T1 distribution No sensory change to C4 to T1 Reflexes normal Left Wrist: Visible swelling over palmar aspect of base of second digit. ROM smooth and normal with good flexion and extension and ulnar/radial deviation that is symmetrical with opposite wrist. TTP on palmar aspect of second MCP joint, localizable to flexor tendon. Tenderness is exacerbated on second finger flexion. Second finger flexion elicited palpable popping. Palpation is otherwise normal over metacarpals, navicular, lunate, and TFCC No snuffbox tenderness. No tenderness over Canal of Guyon. Strength 5/5 in all directions without pain.     Impression and Recommendations:   This case required medical decision making of moderate complexity.  Desiree Rosario is a 61 y.o. Female with a history of cervical DDD who presents with cervical spondylosis and tendinitis of second flexor tendon of left hand.  Cervical spondylosis Neck pain most consistent with cervical facetogenic pain on history and physical. Previous imaging demonstrated arthritic left C4-C5 facet joint. Pt declines formal physical therapy, was provided home rehab exercises  instead. Restarting meloxicam. Follow-up in one month, if pain persists facet injection may be indicated.  2nd digit tendinitis of flexor tendon of left hand Steroid injection placed. Mild  palpable triggering noted. Follow-up in one month.

## 2017-03-06 NOTE — Assessment & Plan Note (Signed)
Suspect cervical facetogenic pain. Declines formal physical therapy. Restarting meloxicam, home rehabilitation exercises. She does have an arthritic left C4-C5 facet joint as well as symptoms consistent with gelling in the morning. If insufficient relief after one month we will proceed with facet injection.

## 2017-03-21 ENCOUNTER — Ambulatory Visit (INDEPENDENT_AMBULATORY_CARE_PROVIDER_SITE_OTHER): Payer: BLUE CROSS/BLUE SHIELD | Admitting: Sports Medicine

## 2017-03-21 DIAGNOSIS — M47812 Spondylosis without myelopathy or radiculopathy, cervical region: Secondary | ICD-10-CM | POA: Diagnosis not present

## 2017-03-21 NOTE — Assessment & Plan Note (Signed)
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 Really didn't do a lot of her rehabilitation exercises but still has significant pain. We are going to proceed with a bilateral C4-C5 facet joint injection. Return one month after the injection to evaluate response.

## 2017-03-21 NOTE — Progress Notes (Signed)
  Subjective:    CC: Follow-up  Desiree Rosario is a pleasant 61 year old female, she has axial neck pain that been present for some time now, we started conservative treatment with NSAIDs and rehabilitation exercises. Fortunately she has not done any of the exercises at home and not approximately continues to have pain. MRI did show left greater than right C4-C5 facet arthritis. Pain is axial, worse with neck extension and rotation with radiation to the top of the scalp. No overt radicular pain. At this point she has failed conservative measures and is agreeable to proceed with interventional treatment.  Past medical history:  Negative.  See flowsheet/record as well for more information.  Surgical history: Negative.  See flowsheet/record as well for more information.  Family history: Negative.  See flowsheet/record as well for more information.  Social history: Negative.  See flowsheet/record as well for more information.  Allergies, and medications have been entered into the medical record, reviewed, and no changes needed.   Review of Systems: No fevers, chills, night sweats, weight loss, chest pain, or shortness of breath.   Objective:    General: Well Developed, well nourished, and in no acute distress.  Neuro: Alert and oriented x3, extra-ocular muscles intact, sensation grossly intact.  HEENT: Normocephalic, atraumatic, pupils equal round reactive to light, neck supple, no masses, no lymphadenopathy, thyroid nonpalpable.  Skin: Warm and dry, no rashes. Cardiac: Regular rate and rhythm, no murmurs rubs or gallops, no lower extremity edema.  Respiratory: Clear to auscultation bilaterally. Not using accessory muscles, speaking in full sentences.  Impression and Recommendations:    Cervical spondylosis Left greater than right C4-C5 facet arthritis. Really didn't do a lot of her rehabilitation exercises but still has significant pain. We are going to proceed with a bilateral C4-C5 facet joint  injection. Return one month after the injection to evaluate response.  I spent 25 minutes with this patient, greater than 50% was face-to-face time counseling regarding the above diagnoses

## 2017-03-28 ENCOUNTER — Other Ambulatory Visit: Payer: BLUE CROSS/BLUE SHIELD

## 2017-03-30 ENCOUNTER — Ambulatory Visit
Admission: RE | Admit: 2017-03-30 | Discharge: 2017-03-30 | Disposition: A | Discharge status: Home / Self Care | Payer: BLUE CROSS/BLUE SHIELD | Source: Ambulatory Visit | Attending: Sports Medicine | Admitting: Sports Medicine

## 2017-03-30 MED ORDER — IOPAMIDOL (ISOVUE-M 300) INJECTION 61%
1.0000 mL | Freq: Once | INTRAMUSCULAR | Status: AC | PRN
  Administered 2017-03-30: 1 mL via INTRA_ARTICULAR

## 2017-03-30 MED ORDER — DEXAMETHASONE SODIUM PHOSPHATE 4 MG/ML IJ SOLN
4.0000 mg | Freq: Once | INTRAMUSCULAR | Status: AC
  Administered 2017-03-30: 4 mg via INTRA_ARTICULAR

## 2017-03-30 NOTE — Discharge Instructions (Signed)

## 2017-04-03 ENCOUNTER — Encounter: Payer: Self-pay | Admitting: Sports Medicine

## 2017-04-03 ENCOUNTER — Ambulatory Visit (INDEPENDENT_AMBULATORY_CARE_PROVIDER_SITE_OTHER): Payer: BLUE CROSS/BLUE SHIELD | Admitting: Sports Medicine

## 2017-04-03 DIAGNOSIS — M47812 Spondylosis without myelopathy or radiculopathy, cervical region: Secondary | ICD-10-CM

## 2017-04-03 MED ORDER — ACETAMINOPHEN ER 650 MG PO TBCR: 1300.0000 mg | EXTENDED_RELEASE_TABLET | Freq: Two times a day (BID) | ORAL | 3 refills | Status: DC

## 2017-04-03 NOTE — Assessment & Plan Note (Signed)
Discs look good, she just had her bilateral C4-C5 facet joint injections 4-5 days ago. We do need to give it some more time, blocks can is not helping, discontinue this, she will do arthritis strength Tylenol.  Return to see me in 3 weeks, aggressive formal physical therapy if no better. Her pain has somewhat shifted, she's hurting more the base of the skull now on the left, question greater occipital neuralgia.

## 2017-04-03 NOTE — Progress Notes (Signed)
  Subjective:    CC: follow-up  HPI: This is a pleasant 61 year old female, she is here a bit early after her for cervical facet joint injections, she had only minimal relief during the injections and she tells me she did not experience concordant pain, and her pain is higher up in the left side at the base of the skull. It's only been 4 days since the injection.  Past medical history:  Negative.  See flowsheet/record as well for more information.  Surgical history: Negative.  See flowsheet/record as well for more information.  Family history: Negative.  See flowsheet/record as well for more information.  Social history: Negative.  See flowsheet/record as well for more information.  Allergies, and medications have been entered into the medical record, reviewed, and no changes needed.   Review of Systems: No fevers, chills, night sweats, weight loss, chest pain, or shortness of breath.   Objective:    General: Well Developed, well nourished, and in no acute distress.  Neuro: Alert and oriented x3, extra-ocular muscles intact, sensation grossly intact.  HEENT: Normocephalic, atraumatic, pupils equal round reactive to light, neck supple, no masses, no lymphadenopathy, thyroid nonpalpable.  Skin: Warm and dry, no rashes. Cardiac: Regular rate and rhythm, no murmurs rubs or gallops, no lower extremity edema.  Respiratory: Clear to auscultation bilaterally. Not using accessory muscles, speaking in full sentences. Neck: Negative spurling's Full neck range of motion Grip strength and sensation normal in bilateral hands Strength good C4 to T1 distribution No sensory change to C4 to T1 Reflexes normal Tenderness is somewhat higher up over the left mastoid process.  Impression and Recommendations:    Cervical spondylosis Discs look good, she just had her bilateral C4-C5 facet joint injections 4-5 days ago. We do need to give it some more time, blocks can is not helping, discontinue this, she  will do arthritis strength Tylenol.  Return to see me in 3 weeks, aggressive formal physical therapy if no better. Her pain has somewhat shifted, she's hurting more the base of the skull now on the left, question greater occipital neuralgia.  I spent 25 minutes with this patient, greater than 50% was face-to-face time counseling regarding the above diagnoses

## 2017-04-06 ENCOUNTER — Emergency Department (HOSPITAL_COMMUNITY)
Admission: EM | Admit: 2017-04-06 | Discharge: 2017-04-06 | Disposition: A | Discharge status: Home / Self Care | Payer: BLUE CROSS/BLUE SHIELD | Attending: Emergency Medicine | Admitting: Emergency Medicine

## 2017-04-06 ENCOUNTER — Encounter (HOSPITAL_COMMUNITY): Payer: Self-pay | Admitting: Emergency Medicine

## 2017-04-06 DIAGNOSIS — Z79899 Other long term (current) drug therapy: Secondary | ICD-10-CM | POA: Diagnosis not present

## 2017-04-06 DIAGNOSIS — Z85828 Personal history of other malignant neoplasm of skin: Secondary | ICD-10-CM | POA: Diagnosis not present

## 2017-04-06 DIAGNOSIS — M62838 Other muscle spasm: Secondary | ICD-10-CM

## 2017-04-06 DIAGNOSIS — M542 Cervicalgia: Secondary | ICD-10-CM | POA: Diagnosis present

## 2017-04-06 DIAGNOSIS — G8929 Other chronic pain: Secondary | ICD-10-CM

## 2017-04-06 MED ORDER — KETOROLAC TROMETHAMINE 60 MG/2ML IM SOLN
60.0000 mg | Freq: Once | INTRAMUSCULAR | Status: AC
  Administered 2017-04-06: 60 mg via INTRAMUSCULAR
  Filled 2017-04-06: qty 2

## 2017-04-06 MED ORDER — METHOCARBAMOL 500 MG PO TABS: 500.0000 mg | ORAL_TABLET | Freq: Four times a day (QID) | ORAL | 0 refills | Status: DC | PRN

## 2017-04-06 NOTE — ED Provider Notes (Signed)
[facility] DEPT Provider Note   CSN: [id number] Arrival date & time: 04/06/17  5027  By signing my name below, I, Sonum Patel, attest that this documentation has been prepared under the direction and in the presence of [facility], PA-C. Electronically Signed: Sonum Patel, Scribe. 04/06/17. 9:51 AM.  History   Chief Complaint Chief Complaint  Patient presents with  . Neck Pain    The history is provided by the patient. No language interpreter was used.     HPI Comments: Desiree Rosario is a 61 y.o. female who presents to the Emergency Department complaining of chronic neck pain with radiation to the lower part of the posterior head that has worsened over the last week. She recently received an injection last week but did not have any relief from it. She states the pain is worse with turning her head. She has had chronic neck issues for nearly 10 years but states the location of the current pain has been ongoing for the past several weeks. She was taking meloxicam prior to the injection without relief. She currently takes Tylenol Arthritis without significant relief. She denies weakness, numbness, tingling, fever, gait abnormality, bowel/bladder incontinence, CP, SOB.   Is currently followed by sports medicine MD for this problem.    Past Medical History:  Diagnosis Date  . Allergy   . Cervical radiculopathy   . GERD (gastroesophageal reflux disease)   . History of colon polyps   . Hyperlipidemia   . IBS (irritable bowel syndrome)   . Obesity   . SCC (squamous cell carcinoma), face     Patient Active Problem List   Diagnosis Date Noted  . Tendinitis of flexor tendon of left hand 03/06/2017  . Irritable bowel syndrome with both constipation and diarrhea 02/10/2017  . Sacral back pain 02/10/2017  . Tubular adenoma of colon 02/10/2017  . Cervical spondylosis 12/02/2016  . Hyperlipidemia 11/23/2015  . 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 History of colonic polyps  11/20/2015  . Allergic rhinitis 11/13/2015  . Bilateral foot pain 06/13/2013    Past Surgical History:  Procedure Laterality Date  . ABLATION    . BREAST LUMPECTOMY     fibroadenomas  . LEG SURGERY     RT- fracture  . ROTATOR CUFF REPAIR Right     OB History    No data available       Home Medications    Prior to Admission medications   Medication Sig Start Date End Date Taking? Authorizing Provider  acetaminophen (TYLENOL) 650 MG CR tablet Take 2 tablets (1,300 mg total) by mouth 2 (two) times daily. 04/03/17   Silverio Decamp, MD  dicyclomine (BENTYL) 10 MG capsule Take 1 capsule (10 mg total) by mouth 3 (three) times daily before meals. 02/09/17   Trixie Dredge, PA-C  Estradiol-Norgestimate (PREFEST PO) Take by mouth daily.    [provider]  methocarbamol (ROBAXIN) 500 MG tablet Take 1-2 tablets (500-1,000 mg total) by mouth every 6 (six) hours as needed for muscle spasms (and pain). 04/06/17   Clayton Bibles, PA-C  pantoprazole (PROTONIX) 40 MG tablet Take 40 mg by mouth daily.    [provider]    Family History Family History  Problem Relation Age of Onset  . Cancer Mother        melanoma  . Diabetes Father   . Stroke Maternal Grandmother     Social History Social History  Substance Use Topics  . Smoking status: Never Smoker  .  Smokeless tobacco: Never Used  . Alcohol use No     Allergies   Patient has no known allergies.   Review of Systems Review of Systems  Constitutional: Negative for fever.  Respiratory: Negative for shortness of breath.   Cardiovascular: Negative for chest pain.  Musculoskeletal: Positive for neck pain. Negative for gait problem.  Neurological: Negative for weakness and numbness.     Physical Exam Updated Vital Signs BP 121/68 (BP Location: Right Arm)   Pulse 71   Temp 98 F (36.7 C) (Oral)   Resp 18   SpO2 100%   Physical Exam  Constitutional: She appears well-developed and  well-nourished. No distress.  HENT:  Head: Normocephalic and atraumatic.  Neck: Neck supple.  Tenderness and tightness of the musculature of the posterior neck and into the trapezius/upper back. Point of most intense pain is at the insertion of the musculature at the base of the scalp. No skin changes  Cardiovascular: Intact distal pulses.   Pulmonary/Chest: Effort normal.  Musculoskeletal: She exhibits tenderness.  Neurological: She is alert.  Upper extremities:  Strength 5/5, sensation intact, distal pulses intact.   Skin: Skin is warm and dry. No rash noted. She is not diaphoretic. No erythema.  Nursing note and vitals reviewed.    ED Treatments / Results  DIAGNOSTIC STUDIES: Oxygen Saturation is 100% on RA, normal by my interpretation.    COORDINATION OF CARE: 9:50 AM Discussed treatment plan with pt at bedside and pt agreed to plan.   Labs (all labs ordered are listed, but only abnormal results are displayed) Labs Reviewed - No data to display  EKG  EKG Interpretation None       Radiology No results found.  Procedures Procedures (including critical care time)  Medications Ordered in ED Medications  ketorolac (TORADOL) injection 60 mg (60 mg Intramuscular Given 04/06/17 1005)     Initial Impression / Assessment and Plan / ED Course  I have reviewed the triage vital signs and the nursing notes.  Pertinent labs & imaging results that were available during my care of the patient were reviewed by me and considered in my medical decision making (see chart for details).     Afebrile, nontoxic patient with chronic neck pain that has not been relieved by recent therapies.  She does not have any red flags for acute cord compression or infection.  She does have significant tightness in her musculature and I suspect her pain is at least in part due to this.  She is unable to do the exercises she has been instructed to do because of this tightness and pain.   D/C home with  robaxin, encouragement to continue with sports medicine, use heat/ice, massage, and do the exercises/stretches advised by her doctor.   Discussed result, findings, treatment, and follow up  with patient.  Pt given return precautions.  Pt verbalizes understanding and agrees with plan.       Final Clinical Impressions(s) / ED Diagnoses   Final diagnoses:  Chronic neck pain  Muscle spasms of neck    New Prescriptions Discharge Medication List as of 04/06/2017 10:00 AM    START taking these medications   Details  methocarbamol (ROBAXIN) 500 MG tablet Take 1-2 tablets (500-1,000 mg total) by mouth every 6 (six) hours as needed for muscle spasms (and pain)., Starting Thu 04/06/2017, Print       I personally performed the services described in this documentation, which was scribed in my presence. The recorded information has been  reviewed and is accurate.    Clayton Bibles, PA-C 04/06/17 1715    Margette Fast, MD 04/07/17 [phone]

## 2017-04-06 NOTE — Discharge Instructions (Signed)
Read the information below.  Use the prescribed medication as directed.  Please discuss all new medications with your pharmacist.  You may return to the Emergency Department at any time for worsening condition or any new symptoms that concern you.     Please use heat and ice to help loosen the muscles of your neck.  You may also try massage.    Follow your sports medicine physician's instructions for exercises.  Please contact his office for follow up.

## 2017-04-06 NOTE — ED Triage Notes (Signed)
Pt sts chronic neck pain that she had injection for last week; pt sts worse pain today; denies new injury

## 2017-04-17 ENCOUNTER — Ambulatory Visit (INDEPENDENT_AMBULATORY_CARE_PROVIDER_SITE_OTHER): Payer: BLUE CROSS/BLUE SHIELD

## 2017-04-17 ENCOUNTER — Ambulatory Visit (INDEPENDENT_AMBULATORY_CARE_PROVIDER_SITE_OTHER): Payer: BLUE CROSS/BLUE SHIELD | Admitting: Sports Medicine

## 2017-04-17 ENCOUNTER — Telehealth: Payer: Self-pay

## 2017-04-17 ENCOUNTER — Encounter: Payer: Self-pay | Admitting: Sports Medicine

## 2017-04-17 DIAGNOSIS — M47812 Spondylosis without myelopathy or radiculopathy, cervical region: Secondary | ICD-10-CM

## 2017-04-17 MED ORDER — CYCLOBENZAPRINE HCL 10 MG PO TABS: ORAL_TABLET | ORAL | 0 refills | Status: DC

## 2017-04-17 MED ORDER — TRAMADOL HCL 50 MG PO TABS: ORAL_TABLET | ORAL | 0 refills | Status: DC

## 2017-04-17 NOTE — Assessment & Plan Note (Signed)
Persistent severe pain in spite of bilateral C4-C5 facet joint injections. Discs looked okay on MRI from 2017, there were a few levels with spondylolisthesis, facet arthritis was the predominant finding. She has been to the emergency department twice, has had a couple falls, CT of the cervical spine was negative in the ED prior to the fall. Pain has moved up to the base of the skull, we are going to obtain a new MRI, I'm adding tramadol, and switching from Robaxin to Flexeril. Return to see me to go over MRI results. Do suspect we will need to proceed with pain management referral for multilevel cervical facet medial branch blocks.  Certainly greater occipital neuralgia is a possibility.

## 2017-04-17 NOTE — Progress Notes (Signed)
  Subjective:    CC: Neck pain  HPI: For months now this pleasant 61 year old female has had axial neck pain, MRI from last year showed cervical facet arthritis and a few levels of mild spondylolisthesis, no center canal stenosis or foraminal stenosis, no degenerative disc disease. After failing physical therapy, at home, we proceeded with C4-C5 facet joint injections which appeared the most severe, she really didn't have much relief. Overall week after the injections she started to feel some improvement, but then some worsening, went to the emergency department twice and was given some muscle relaxers and NSAIDs but nothing else. On her last ED visit at [facility] she ended up with a cervical CT scan that showed nothing but facet arthritis as expected. Then a couple days ago she was standing on a ladder which broke, she fell impacting her head, no loss of consciousness but did worsen her neck pain. At this point and neck pain is severe, nothing radicular, axial pain worse with rotation and extension.  Past medical history:  Negative.  See flowsheet/record as well for more information.  Surgical history: Negative.  See flowsheet/record as well for more information.  Family history: Negative.  See flowsheet/record as well for more information.  Social history: Negative.  See flowsheet/record as well for more information.  Allergies, and medications have been entered into the medical record, reviewed, and no changes needed.   Review of Systems: No fevers, chills, night sweats, weight loss, chest pain, or shortness of breath.   Objective:    General: Well Developed, well nourished, and in no acute distress.  Neuro: Alert and oriented x3, extra-ocular muscles intact, sensation grossly intact.  HEENT: Normocephalic, atraumatic, pupils equal round reactive to light, neck supple, no masses, no lymphadenopathy, thyroid nonpalpable.  Skin: Warm and dry, no rashes. Cardiac: Regular rate and  rhythm, no murmurs rubs or gallops, no lower extremity edema.  Respiratory: Clear to auscultation bilaterally. Not using accessory muscles, speaking in full sentences. Neck: Negative spurling's Somewhat limited active range of motion. Grip strength and sensation normal in bilateral hands Strength good C4 to T1 distribution No sensory change to C4 to T1 Reflexes normal  Impression and Recommendations:    Cervical spondylosis Persistent severe pain in spite of bilateral C4-C5 facet joint injections. Discs looked okay on MRI from 2017, there were a few levels with spondylolisthesis, facet arthritis was the predominant finding. She has been to the emergency department twice, has had a couple falls, CT of the cervical spine was negative in the ED prior to the fall. Pain has moved up to the base of the skull, we are going to obtain a new MRI, I'm adding tramadol, and switching from Robaxin to Flexeril. Return to see me to go over MRI results. Do suspect we will need to proceed with pain management referral for multilevel cervical facet medial branch blocks.  Certainly greater occipital neuralgia is a possibility.  I spent 25 minutes with this patient, greater than 50% was face-to-face time counseling regarding the above diagnoses

## 2017-04-17 NOTE — Telephone Encounter (Signed)
Called [employer] for PA for MR Cervical Spine WO contrast. No Prior Auth needed.

## 2017-04-18 ENCOUNTER — Ambulatory Visit: Payer: BLUE CROSS/BLUE SHIELD | Admitting: Sports Medicine

## 2017-04-18 ENCOUNTER — Encounter: Payer: Self-pay | Admitting: Sports Medicine

## 2017-04-18 ENCOUNTER — Ambulatory Visit (INDEPENDENT_AMBULATORY_CARE_PROVIDER_SITE_OTHER): Payer: BLUE CROSS/BLUE SHIELD | Admitting: Sports Medicine

## 2017-04-18 DIAGNOSIS — M47812 Spondylosis without myelopathy or radiculopathy, cervical region: Secondary | ICD-10-CM | POA: Diagnosis not present

## 2017-04-18 NOTE — Progress Notes (Signed)
  Subjective:    CC: Follow-up MRI results  HPI: Desiree Rosario returns, she is a pleasant 61 year old female, she has had C4-C5 facet joint injections with only minimal relief, pain returned after a fall. She did desire that we repeat her MRI, this was done, it was the same as the one last year. Unfortunately she continues to pain in spite of home rehabilitation exercises, anti-inflammatories. We did start a new muscle relaxer at the last visit and at tramadol, she hasn't gotten any of these from the pharmacy. She remains very dubious about the MRI regarding her symptoms, and requests to look at the images herself.  Past medical history:  Negative.  See flowsheet/record as well for more information.  Surgical history: Negative.  See flowsheet/record as well for more information.  Family history: Negative.  See flowsheet/record as well for more information.  Social history: Negative.  See flowsheet/record as well for more information.  Allergies, and medications have been entered into the medical record, reviewed, and no changes needed.   Review of Systems: No fevers, chills, night sweats, weight loss, chest pain, or shortness of breath.   Objective:    General: Well Developed, well nourished, and in no acute distress.  Neuro: Alert and oriented x3, extra-ocular muscles intact, sensation grossly intact.  HEENT: Normocephalic, atraumatic, pupils equal round reactive to light, neck supple, no masses, no lymphadenopathy, thyroid nonpalpable.  Skin: Warm and dry, no rashes. Cardiac: Regular rate and rhythm, no murmurs rubs or gallops, no lower extremity edema.  Respiratory: Clear to auscultation bilaterally. Not using accessory muscles, speaking in full sentences.  MRI personally reviewed, there is left greater than right C4-C5 degenerative facet arthrosis, there is also a bit of C4-C5 anterolisthesis. The discs look okay.  Impression and Recommendations:    Cervical spondylosis MRIs overall  unchanged from last year, she still has C4-C5 facet arthritis, I do suspect that she has multiple facet pain generators, for this reason I would recommend that we proceed with pain management referral for consideration of multilevel cervical facet medial branch blocks. Obviously of good relief to this she would be a candidate for cervical radiofrequency ablation. We did switch to Flexeril, she will continue this for now. She will think about it and let me know if she would like to proceed with pain management referral.  I spent 40 minutes with this patient, greater than 50% was face-to-face time counseling regarding the above diagnoses

## 2017-04-18 NOTE — Assessment & Plan Note (Signed)
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 Obviously of good relief to this she would be a candidate for cervical radiofrequency ablation. We did switch to Flexeril, she will continue this for now. She will think about it and let me know if she would like to proceed with pain management referral.

## 2017-04-24 ENCOUNTER — Ambulatory Visit: Payer: BLUE CROSS/BLUE SHIELD | Admitting: Sports Medicine

## 2017-04-24 ENCOUNTER — Telehealth: Payer: Self-pay | Admitting: *Deleted

## 2017-04-24 DIAGNOSIS — G8929 Other chronic pain: Secondary | ICD-10-CM

## 2017-04-24 DIAGNOSIS — M542 Cervicalgia: Principal | ICD-10-CM

## 2017-04-24 NOTE — Telephone Encounter (Signed)
Done

## 2017-04-24 NOTE — Telephone Encounter (Signed)
Patient called and wanted to proceed with pain management. She prefers to see Dr. Cathlean Sauer [phone]) in [place] [facility] or whoever she can get in with first.

## 2017-07-06 LAB — HM PAP SMEAR: HM PAP: NEGATIVE

## 2017-08-02 LAB — HM MAMMOGRAPHY

## 2017-09-15 ENCOUNTER — Ambulatory Visit (INDEPENDENT_AMBULATORY_CARE_PROVIDER_SITE_OTHER): Payer: BLUE CROSS/BLUE SHIELD | Admitting: Sports Medicine

## 2017-09-15 ENCOUNTER — Encounter: Payer: Self-pay | Admitting: Sports Medicine

## 2017-09-15 DIAGNOSIS — G5602 Carpal tunnel syndrome, left upper limb: Secondary | ICD-10-CM

## 2017-09-15 NOTE — Assessment & Plan Note (Signed)
Classic carpal tunnel syndrome with a positive Tinel sign. Adding nighttime splinting on the left. Rehab exercises given. If insufficient relief at the follow-up visit we will proceed with splinting through the entire day before considering median nerve hydrodissection under ultrasound guidance.

## 2017-09-15 NOTE — Progress Notes (Signed)
   Subjective:    I'm seeing this patient as a consultation for: Nelson Chimes, PA-C  CC: Left hand pain  HPI: For the past several months the 61 year old female has had increasing stiffness, pain in her left hand, worse at night, in the morning.  She occasionally gets numbness and tingling into the fingers, in the morning and when gripping her steering wheel.  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  Symptoms radiate to the mid forearm but not proximal to this.  Past medical history, Surgical history, Family history not pertinant except as noted below, Social history, Allergies, and medications have been entered into the medical record, reviewed, and no changes needed.   (To billers/coders, pertinent past medical, social, surgical, family history can be found in problem list, if problem list is marked as reviewed then this indicates that past medical, social, surgical, family history was also reviewed)  Review of Systems: No headache, visual changes, nausea, vomiting, diarrhea, constipation, dizziness, abdominal pain, skin rash, fevers, chills, night sweats, weight loss, swollen lymph nodes, body aches, joint swelling, muscle aches, chest pain, shortness of breath, mood changes, visual or auditory hallucinations.   Objective:   General: Well Developed, well nourished, and in no acute distress.  Neuro:  Extra-ocular muscles intact, able to move all 4 extremities, sensation grossly intact.  Deep tendon reflexes tested were normal. Psych: Alert and oriented, mood congruent with affect. ENT:  Ears and nose appear unremarkable.  Hearing grossly normal. Neck: Unremarkable overall appearance, trachea midline.  No visible thyroid enlargement. Eyes: Conjunctivae and lids appear unremarkable.  Pupils equal and round. Skin: Warm and dry, no rashes noted.  Cardiovascular: Pulses palpable, no extremity edema. Left  wrist: Inspection normal with no visible erythema or swelling. ROM smooth and normal with good flexion and extension and ulnar/radial deviation that is symmetrical with opposite wrist. Palpation is normal over metacarpals, navicular, lunate, and TFCC; tendons without tenderness/ swelling No snuffbox tenderness. No tenderness over Canal of [place]. Strength 5/5 in all directions without pain. Positive Tinel's and Phalen signs. Negative Finkelstein sign. Negative Watson's test.  Impression and Recommendations:   This case required medical decision making of moderate complexity.  Carpal tunnel syndrome, left Classic carpal tunnel syndrome with a positive Tinel sign. Adding nighttime splinting on the left. Rehab exercises given. If insufficient relief at the follow-up visit we will proceed with splinting through the entire day before considering median nerve hydrodissection under ultrasound guidance. ___________________________________________ Gwen Her. Dianah Field, M.D., ABFM., CAQSM. Primary Care and [place] Instructor of [employer] [facility]

## 2017-10-16 ENCOUNTER — Ambulatory Visit: Payer: BLUE CROSS/BLUE SHIELD | Admitting: Sports Medicine

## 2017-10-27 ENCOUNTER — Encounter: Payer: Self-pay | Admitting: Physician Assistant

## 2017-10-27 ENCOUNTER — Ambulatory Visit (INDEPENDENT_AMBULATORY_CARE_PROVIDER_SITE_OTHER): Payer: BLUE CROSS/BLUE SHIELD | Admitting: Physician Assistant

## 2017-10-27 VITALS — BP 125/86 | HR 58 | Temp 98.1°F | Resp 14 | Wt 177.0 lb

## 2017-10-27 DIAGNOSIS — R05 Cough: Secondary | ICD-10-CM

## 2017-10-27 DIAGNOSIS — F458 Other somatoform disorders: Secondary | ICD-10-CM

## 2017-10-27 DIAGNOSIS — R198 Other specified symptoms and signs involving the digestive system and abdomen: Secondary | ICD-10-CM | POA: Insufficient documentation

## 2017-10-27 DIAGNOSIS — R058 Other specified cough: Secondary | ICD-10-CM

## 2017-10-27 DIAGNOSIS — R0989 Other specified symptoms and signs involving the circulatory and respiratory systems: Secondary | ICD-10-CM

## 2017-10-27 DIAGNOSIS — N951 Menopausal and female climacteric states: Secondary | ICD-10-CM

## 2017-10-27 HISTORY — DX: Menopausal and female climacteric states: N95.1

## 2017-10-27 MED ORDER — HYDROXYZINE HCL 25 MG PO TABS: 25.0000 mg | ORAL_TABLET | Freq: Every day | ORAL | 0 refills | Status: DC

## 2017-10-27 MED ORDER — FLUTICASONE PROPIONATE 50 MCG/ACT NA SUSP: 1.0000 | Freq: Every day | NASAL | 0 refills | Status: DC | PRN

## 2017-10-27 NOTE — Patient Instructions (Addendum)
-   Flonase 1 spray each nostril nightly - Hydroxyzine at bedtime - Continue Zyrtec and Pantoprazole - Follow a GERD diet (see below)  Food Choices for Gastroesophageal Reflux Disease, Adult When you have gastroesophageal reflux disease (GERD), the foods you eat and your eating habits are very important. Choosing the right foods can help ease your discomfort. What guidelines do I need to follow?  Choose fruits, vegetables, whole grains, and low-fat dairy products.  Choose low-fat meat, fish, and poultry.  Limit fats such as oils, salad dressings, butter, nuts, and avocado.  Keep a food diary. This helps you identify foods that cause symptoms.  Avoid foods that cause symptoms. These may be different for everyone.  Eat small meals often instead of 3 large meals a day.  Eat your meals slowly, in a place where you are relaxed.  Limit fried foods.  Cook foods using methods other than frying.  Avoid drinking alcohol.  Avoid drinking large amounts of liquids with your meals.  Avoid bending over or lying down until 2-3 hours after eating. What foods are not recommended? These are some foods and drinks that may make your symptoms worse: Vegetables Tomatoes. Tomato juice. Tomato and spaghetti sauce. Chili peppers. Onion and garlic. Horseradish. Fruits Oranges, grapefruit, and lemon (fruit and juice). Meats High-fat meats, fish, and poultry. This includes hot dogs, ribs, ham, sausage, salami, and bacon. Dairy Whole milk and chocolate milk. Sour cream. Cream. Butter. Ice cream. Cream cheese. Drinks Coffee and tea. Bubbly (carbonated) drinks or energy drinks. Condiments Hot sauce. Barbecue sauce. Sweets/Desserts Chocolate and cocoa. Donuts. Peppermint and spearmint. Fats and Oils High-fat foods. This includes [place] fries and potato chips. Other Vinegar. Strong spices. This includes black pepper, white pepper, red pepper, cayenne, curry powder, cloves, ginger, and chili  powder. The items listed above may not be a complete list of foods and drinks to avoid. Contact your dietitian for more information. This information is not intended to replace advice given to you by your health care provider. Make sure you discuss any questions you have with your health care provider. Document Released: 03/13/2012 Document Revised: 02/18/2016 Document Reviewed: 07/17/2013 Elsevier Interactive Patient Education  2017 [employer].

## 2017-10-27 NOTE — Progress Notes (Signed)
HPI:                                                                Desiree Rosario is a 62 y.o. female who presents to [facility] today for cough  Cough  This is a new problem. The current episode started more than 1 month ago. The problem has been unchanged. The problem occurs every few minutes. The cough is non-productive. Associated symptoms include headaches, heartburn, nasal congestion and postnasal drip. Pertinent negatives include no chest pain, chills, fever, hemoptysis, myalgias, shortness of breath, sweats, weight loss or wheezing. Associated symptoms comments: + globus sensation . Nothing aggravates the symptoms. She has tried nothing for the symptoms. There is no history of asthma, COPD or environmental allergies.    Depression screen [facility] 2/9 09/15/2017  Decreased Interest 0  Down, Depressed, Hopeless 0  PHQ - 2 Score 0    No flowsheet data found.    Past Medical History:  Diagnosis Date  . Allergy   . Cervical radiculopathy   . GERD (gastroesophageal reflux disease)   . History of colon polyps   . Hyperlipidemia   . IBS (irritable bowel syndrome)   . Obesity   . SCC (squamous cell carcinoma), face    Past Surgical History:  Procedure Laterality Date  . ABLATION    . BREAST LUMPECTOMY     fibroadenomas  . LEG SURGERY     RT- fracture  . ROTATOR CUFF REPAIR Right    Social History   Tobacco Use  . Smoking status: Never Smoker  . Smokeless tobacco: Never Used  Substance Use Topics  . Alcohol use: No   family history includes Cancer in her mother; Diabetes in her father; Stroke in her maternal grandmother.    ROS: negative except as noted in the HPI  Medications: Current Outpatient Medications  Medication Sig Dispense Refill  . acetaminophen (TYLENOL) 650 MG CR tablet Take by mouth.    . colestipol (COLESTID) 1 g tablet Take by mouth.    . dicyclomine (BENTYL) 10 MG capsule Take 1 capsule (10 mg  total) by mouth 3 (three) times daily before meals. 90 capsule 1  . pantoprazole (PROTONIX) 40 MG tablet Take 40 mg by mouth daily.    . cetirizine (ZYRTEC) 10 MG tablet Take by mouth.    . fluocinonide cream (LIDEX) 0.05 % APPLY TWICE DAILY TO PSORIASIS AS NEEDED  1  . fluticasone (FLONASE) 50 MCG/ACT nasal spray Place 1 spray into both nostrils daily as needed for allergies or rhinitis. 16 g 0  . hydrOXYzine (ATARAX/VISTARIL) 25 MG tablet Take 1 tablet (25 mg total) by mouth at bedtime. 30 tablet 0  . PARoxetine (PAXIL) 10 MG tablet Take 1 tablet by mouth daily.  4   No current facility-administered medications for this visit.    No Known Allergies     Objective:  BP 125/86   Pulse (!) 58   Temp 98.1 F (36.7 C) (Oral)   Wt 177 lb (80.3 kg)   BMI 29.91 kg/m  Gen:  alert, not ill-appearing, no distress, appropriate for age HEENT: head normocephalic without obvious abnormality, conjunctiva and cornea clear, wearing glasses, right TM obstructed by cerumen, left TM clear, nasal mucosa edematous,  oropharynx clear, cobblestoning of the posterior third of the tongue, no frontal or maxillary sinus tenderness, neck supple, trachea midline Pulm: Normal work of breathing, normal phonation, clear to auscultation bilaterally, no wheezes, rales or rhonchi CV: bradycardic, regular rhythm, s1 and s2 distinct, no murmurs, clicks or rubs  Neuro: alert and oriented x 3, no tremor MSK: extremities atraumatic, normal gait and station Skin: intact, no rashes on exposed skin, no jaundice, no cyanosis Psych: well-groomed, cooperative, good eye contact, euthymic mood, affect mood-congruent, speech is articulate, and thought processes clear and goal-directed    No results found for this or any previous visit (from the past 72 hour(s)). No results found.    Assessment and Plan: 62 y.o. female with   1. Globus sensation   2. Upper airway cough syndrome - 62 yo F with PMH of GERD, no history of  pulmonary disease/smoking exposure, presenting with non-productive cough, throat clearing, globus sensation and nasal congestion. No constitutional symptoms, wheezing or dyspnea. No evidence of respiratory infection on exam. Treating as upper airway cough syndrome. - adding intranasal corticosteroid and histamine at bedtime. Continue PPI and Zyrtec. Counseled on GERD diet. Close follow-up in 3 weeks - fluticasone (FLONASE) 50 MCG/ACT nasal spray; Place 1 spray into both nostrils daily as needed for allergies or rhinitis.  Dispense: 16 g; Refill: 0 - hydrOXYzine (ATARAX/VISTARIL) 25 MG tablet; Take 1 tablet (25 mg total) by mouth at bedtime.  Dispense: 30 tablet; Refill: 0   Patient education and anticipatory guidance given Patient agrees with treatment plan Follow-up in 3 weeks or sooner  as needed if symptoms worsen or fail to improve  Darlyne Russian PA-C

## 2017-11-08 ENCOUNTER — Encounter: Payer: Self-pay | Admitting: Physician Assistant

## 2017-11-20 ENCOUNTER — Encounter: Payer: Self-pay | Admitting: Physician Assistant

## 2017-11-23 ENCOUNTER — Other Ambulatory Visit: Payer: Self-pay | Admitting: Physician Assistant

## 2017-11-23 DIAGNOSIS — R058 Other specified cough: Secondary | ICD-10-CM

## 2017-11-23 DIAGNOSIS — R05 Cough: Secondary | ICD-10-CM

## 2018-04-06 ENCOUNTER — Other Ambulatory Visit: Payer: Self-pay

## 2018-04-06 ENCOUNTER — Emergency Department (INDEPENDENT_AMBULATORY_CARE_PROVIDER_SITE_OTHER): Payer: BLUE CROSS/BLUE SHIELD

## 2018-04-06 ENCOUNTER — Emergency Department (INDEPENDENT_AMBULATORY_CARE_PROVIDER_SITE_OTHER)
Admission: EM | Admit: 2018-04-06 | Discharge: 2018-04-06 | Disposition: A | Discharge status: Home / Self Care | Payer: BLUE CROSS/BLUE SHIELD | Source: Home / Self Care | Attending: Family Medicine | Admitting: Family Medicine

## 2018-04-06 DIAGNOSIS — M546 Pain in thoracic spine: Secondary | ICD-10-CM

## 2018-04-06 DIAGNOSIS — R079 Chest pain, unspecified: Secondary | ICD-10-CM | POA: Diagnosis not present

## 2018-04-06 MED ORDER — CYCLOBENZAPRINE HCL 10 MG PO TABS: 10.0000 mg | ORAL_TABLET | Freq: Every day | ORAL | 1 refills | Status: DC

## 2018-04-06 MED ORDER — PREDNISONE 20 MG PO TABS: ORAL_TABLET | ORAL | 0 refills | Status: DC

## 2018-04-06 NOTE — Discharge Instructions (Signed)
Apply ice pack for 20 to 30 minutes, 3 to 4 times daily  Continue until pain and swelling decrease.  Call if rash develops.

## 2018-04-06 NOTE — ED Provider Notes (Signed)
[name]    CSN: [id number] Arrival date & time: 04/06/18  1118     History   Chief Complaint Chief Complaint  Patient presents with  . Spasms    lower back    HPI Desiree Rosario is a 62 y.o. female.   Patient complains of two day history of pain in her left posterior back and left anterior/lateral chest.  The pain is worse with inspiration/cough and chest movement.  The pain awakens her at night.  She denies rash.  Ice application is not helpful.  No recent URI.  She recalls no injury.  The history is provided by the patient.  Chest Pain  Pain location:  L lateral chest and L chest Pain quality: aching   Pain radiates to:  Upper back Pain severity:  Moderate Onset quality:  Sudden Duration:  2 days Timing:  Constant Progression:  Unchanged Chronicity:  New Context: breathing, lifting, movement and at rest   Context: not eating and not trauma   Relieved by:  Nothing Worsened by:  Coughing, deep breathing, certain positions and movement Ineffective treatments: ice packs. Associated symptoms: back pain   Associated symptoms: no abdominal pain, no cough, no diaphoresis, no dysphagia, no fatigue, no fever, no heartburn, no lower extremity edema, no nausea, no palpitations, no PND and no shortness of breath     Past Medical History:  Diagnosis Date  . Allergy   . Cervical radiculopathy   . GERD (gastroesophageal reflux disease)   . History of colon polyps   . Hyperlipidemia   . IBS (irritable bowel syndrome)   . Menopausal vasomotor syndrome 10/27/2017  . Obesity   . SCC (squamous cell carcinoma), face     Patient Active Problem List   Diagnosis Date Noted  . Upper airway cough syndrome 10/27/2017  . Globus sensation 10/27/2017  . Menopausal vasomotor syndrome 10/27/2017  . Carpal tunnel syndrome, left 09/15/2017  . Tendinitis of flexor tendon of left hand 03/06/2017  . Irritable bowel syndrome with both constipation and diarrhea 02/10/2017  .  Sacral back pain 02/10/2017  . Tubular adenoma of colon 02/10/2017  . Cervical spondylosis 12/02/2016  . Hyperlipidemia 11/23/2015  . 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 History of colonic polyps 11/20/2015  . Allergic rhinitis 11/13/2015  . Bilateral foot pain 06/13/2013    Past Surgical History:  Procedure Laterality Date  . ABLATION    . BREAST LUMPECTOMY     fibroadenomas  . LEG SURGERY     RT- fracture  . ROTATOR CUFF REPAIR Right     OB History   None      Home Medications    Prior to Admission medications   Medication Sig Start Date End Date Taking? Authorizing Provider  acetaminophen (TYLENOL) 650 MG CR tablet Take by mouth. 04/03/17   [provider]  cetirizine (ZYRTEC) 10 MG tablet Take by mouth.    [provider]  colestipol (COLESTID) 1 g tablet Take by mouth. 08/03/17   [provider]  cyclobenzaprine (FLEXERIL) 10 MG tablet Take 1 tablet (10 mg total) by mouth at bedtime. 04/06/18   Kandra Nicolas, MD  dicyclomine (BENTYL) 10 MG capsule Take 1 capsule (10 mg total) by mouth 3 (three) times daily before meals. 02/09/17   Trixie Dredge, PA-C  fluocinonide cream (LIDEX) 0.05 % APPLY TWICE DAILY TO PSORIASIS AS NEEDED 07/27/17   [provider]  fluticasone (FLONASE) 50 MCG/ACT nasal spray Place 1 spray into both nostrils  daily as needed for allergies or rhinitis. 10/27/17   Trixie Dredge, PA-C  hydrOXYzine (ATARAX/VISTARIL) 25 MG tablet Take 1 tablet (25 mg total) by mouth at bedtime. 10/27/17   Trixie Dredge, PA-C  pantoprazole (PROTONIX) 40 MG tablet Take 40 mg by mouth daily.    [provider]  PARoxetine (PAXIL) 10 MG tablet Take 1 tablet by mouth daily. 09/22/17   [provider]  predniSONE (DELTASONE) 20 MG tablet Take one tab by mouth twice daily for 4 days, then one daily for 3 days. Take with food. 04/06/18   Kandra Nicolas, MD    Family History Family  History  Problem Relation Age of Onset  . Cancer Mother        melanoma  . Diabetes Father   . Stroke Maternal Grandmother     Social History Social History   Tobacco Use  . Smoking status: Never Smoker  . Smokeless tobacco: Never Used  Substance Use Topics  . Alcohol use: No  . Drug use: No     Allergies   Patient has no known allergies.   Review of Systems Review of Systems  Constitutional: Negative for diaphoresis, fatigue and fever.  HENT: Negative for trouble swallowing.   Respiratory: Negative for cough and shortness of breath.   Cardiovascular: Positive for chest pain. Negative for palpitations and PND.  Gastrointestinal: Negative for abdominal pain, heartburn and nausea.  Musculoskeletal: Positive for back pain.  All other systems reviewed and are negative.    Physical Exam Triage Vital Signs ED Triage Vitals  Enc Vitals Group     BP 04/06/18 1135 (!) 150/82     Pulse Rate 04/06/18 1135 (!) 59     Resp --      Temp 04/06/18 1135 97.7 F (36.5 C)     Temp Source 04/06/18 1135 Oral     SpO2 04/06/18 1135 98 %     Weight 04/06/18 1138 174 lb (78.9 kg)     Height 04/06/18 1138 5\' 4"  (1.626 m)     Head Circumference --      Peak Flow --      Pain Score 04/06/18 1138 0     Pain Loc --      Pain Edu? --      Excl. in [place]? --    No data found.  Updated Vital Signs BP (!) 150/82 (BP Location: Right Arm)   Pulse (!) 59   Temp 97.7 F (36.5 C) (Oral)   Ht 5\' 4"  (1.626 m)   Wt 174 lb (78.9 kg)   SpO2 98%   BMI 29.87 kg/m   Visual Acuity Right Eye Distance:   Left Eye Distance:   Bilateral Distance:    Right Eye Near:   Left Eye Near:    Bilateral Near:     Physical Exam  Constitutional: She appears well-developed and well-nourished. No distress.  HENT:  Head: Normocephalic.  Right Ear: External ear normal.  Left Ear: External ear normal.  Nose: Nose normal.  Mouth/Throat: Oropharynx is clear and moist.  Eyes: Pupils are equal, round,  and reactive to light. Conjunctivae are normal.  Neck: Normal range of motion. Neck supple.  Cardiovascular: Normal heart sounds.  Pulmonary/Chest: Breath sounds normal.     She exhibits tenderness. She exhibits no bony tenderness, no crepitus and no swelling.  There is vague tenderness to palpation left anterior/lateral/posterior chest as noted on diagram.  No rash present.    Abdominal: There is  no tenderness.  Musculoskeletal: She exhibits no edema or tenderness.  Lymphadenopathy:    She has no cervical adenopathy.  Neurological: She is alert.  Skin: Skin is warm and dry. No rash noted.  Nursing note and vitals reviewed.    UC Treatments / Results  Labs (all labs ordered are listed, but only abnormal results are displayed) Labs Reviewed - No data to display  EKG None  Radiology Dg Chest 2 View  Result Date: 04/06/2018 CLINICAL DATA:  Chest pain EXAM: CHEST - 2 VIEW COMPARISON:  August 16, 2014 FINDINGS: There is no edema or consolidation. The heart size and pulmonary vascularity are normal. No adenopathy. No pneumothorax. No bone lesions. IMPRESSION: No edema or consolidation. Electronically Signed   By: Lowella Grip III M.D.   On: 04/06/2018 13:01    Procedures Procedures (including critical care time)  Medications Ordered in UC Medications - No data to display  Initial Impression / Assessment and Plan / UC Course  I have reviewed the triage vital signs and the nursing notes.  Pertinent labs & imaging results that were available during my care of the patient were reviewed by me and considered in my medical decision making (see chart for details).    Begin prednisone burst/taper, and Flexeril at bedtime. Doubt early shingles. Followup with Family Doctor if not improved in about 2 weeks.   Final Clinical Impressions(s) / UC Diagnoses   Final diagnoses:  Acute left-sided thoracic back pain     Discharge Instructions     Apply ice pack for 20 to 30  minutes, 3 to 4 times daily  Continue until pain and swelling decrease.  Call if rash develops.    ED Prescriptions    Medication Sig Dispense Auth. Provider   predniSONE (DELTASONE) 20 MG tablet Take one tab by mouth twice daily for 4 days, then one daily for 3 days. Take with food. 11 tablet Kandra Nicolas, MD   cyclobenzaprine (FLEXERIL) 10 MG tablet Take 1 tablet (10 mg total) by mouth at bedtime. 10 tablet Kandra Nicolas, MD        Kandra Nicolas, MD 04/13/18 0630

## 2018-04-06 NOTE — ED Triage Notes (Signed)
Pt c/o back spasms in her lower back radiating toward the left side x 2 days.  Is seeing a chiro for neck issues and was recommended to ice it.

## 2018-04-08 ENCOUNTER — Telehealth: Payer: Self-pay | Admitting: Emergency Medicine

## 2018-04-08 NOTE — Telephone Encounter (Signed)
Contacted

## 2018-04-08 NOTE — Telephone Encounter (Signed)
Contacted patient she states that she is doing much better. No further follow up needed.

## 2018-05-01 ENCOUNTER — Ambulatory Visit (INDEPENDENT_AMBULATORY_CARE_PROVIDER_SITE_OTHER): Payer: BLUE CROSS/BLUE SHIELD | Admitting: Physician Assistant

## 2018-05-01 ENCOUNTER — Encounter: Payer: Self-pay | Admitting: Physician Assistant

## 2018-05-01 VITALS — BP 145/91 | HR 69 | Temp 98.2°F | Wt 180.0 lb

## 2018-05-01 DIAGNOSIS — M546 Pain in thoracic spine: Secondary | ICD-10-CM | POA: Diagnosis not present

## 2018-05-01 MED ORDER — TRAMADOL HCL 50 MG PO TABS: 50.0000 mg | ORAL_TABLET | Freq: Four times a day (QID) | ORAL | 0 refills | Status: DC | PRN

## 2018-05-01 MED ORDER — NAPROXEN 500 MG PO TBEC: 500.0000 mg | DELAYED_RELEASE_TABLET | Freq: Two times a day (BID) | ORAL | 0 refills | Status: DC

## 2018-05-01 NOTE — Patient Instructions (Addendum)
-   spine rehab exercises at home daily - Naprosyn twice a day for 2 weeks, then twice daily as needed - Tramadol 1 tab every 6 hours as needed for severe pain - Ice/Heat - TENS unit

## 2018-05-01 NOTE — Progress Notes (Signed)
HPI:                                                                Desiree Rosario is a 62 y.o. female who presents to [facility]: [facility] today for back pain  Pleasant 62 yo F with PMH of cervical DDD who presents with approximately 1 month of gradually worsening thoracic back pain. Presented to urgent care on 04/06/18 with left-sided thoracic worse with inspiration, coughing and bending. CXR was negative. She was treated with Prednisone burst and muscle relaxer as needed.  Reports temporary improvement of symptoms after completing Prednisone, but symptoms have unfortunately recurred.  Has been having regular chiropractor adjustments, most recently last week, which has helped her neck pain. She also reports mild relief with Tylenol.  Today complains of left-sided back pain just below the rib cage. Pain is persistent, described as "wrenching and sharp." Does not radiate. Worse with coughing/sneezing, laying on left side. Denies constitutional symptoms, nausea/vomiting, dysuria, bowel/bladder dysfunction, saddle numbness or radicular symptoms.   Depression screen [facility] 2/9 09/15/2017  Decreased Interest 0  Down, Depressed, Hopeless 0  PHQ - 2 Score 0    No flowsheet data found.    Past Medical History:  Diagnosis Date  . Allergy   . Cervical radiculopathy   . GERD (gastroesophageal reflux disease)   . History of colon polyps   . Hyperlipidemia   . IBS (irritable bowel syndrome)   . Menopausal vasomotor syndrome 10/27/2017  . Obesity   . SCC (squamous cell carcinoma), face    Past Surgical History:  Procedure Laterality Date  . ABLATION    . BREAST LUMPECTOMY     fibroadenomas  . LEG SURGERY     RT- fracture  . ROTATOR CUFF REPAIR Right    Social History   Tobacco Use  . Smoking status: Never Smoker  . Smokeless tobacco: Never Used  Substance Use Topics  . Alcohol use: No   family history includes Cancer in her mother; Diabetes in  her father; Stroke in her maternal grandmother.    ROS: negative except as noted in the HPI  Medications: Current Outpatient Medications  Medication Sig Dispense Refill  . acetaminophen (TYLENOL) 650 MG CR tablet Take by mouth.    . cetirizine (ZYRTEC) 10 MG tablet Take by mouth.    . colestipol (COLESTID) 1 g tablet Take by mouth.    . cyclobenzaprine (FLEXERIL) 10 MG tablet Take 1 tablet (10 mg total) by mouth at bedtime. 10 tablet 1  . dicyclomine (BENTYL) 10 MG capsule Take 1 capsule (10 mg total) by mouth 3 (three) times daily before meals. 90 capsule 1  . fluocinonide cream (LIDEX) 0.05 % APPLY TWICE DAILY TO PSORIASIS AS NEEDED  1  . fluticasone (FLONASE) 50 MCG/ACT nasal spray Place 1 spray into both nostrils daily as needed for allergies or rhinitis. 16 g 0  . hydrOXYzine (ATARAX/VISTARIL) 25 MG tablet Take 1 tablet (25 mg total) by mouth at bedtime. 30 tablet 0  . pantoprazole (PROTONIX) 40 MG tablet Take 40 mg by mouth daily.    Marland Kitchen PARoxetine (PAXIL) 10 MG tablet Take 1 tablet by mouth daily.  4   No current facility-administered medications for this visit.    No  Known Allergies     Objective:  BP (!) 145/91   Pulse 69   Temp 98.2 F (36.8 C) (Oral)   Wt 180 lb (81.6 kg)   BMI 30.90 kg/m  Gen:  alert, not ill-appearing, no distress, appropriate for age 35: head normocephalic without obvious abnormality, conjunctiva and cornea clear, trachea midline Pulm: Normal work of breathing, normal phonation, clear to auscultation bilaterally, no wheezes, rales or rhonchi CV: Normal rate, regular rhythm, s1 and s2 distinct, no murmurs, clicks or rubs  Neuro: alert and oriented x 3, no tremor MSK: extremities atraumatic, normal gait and station Back: atraumatic, no midline tenderness, no CVA tenderness Skin: intact, no rashes on exposed skin, no jaundice, no cyanosis Psych: well-groomed, cooperative, good eye contact, euthymic mood, affect mood-congruent, speech is  articulate, and thought processes clear and goal-directed    No results found for this or any previous visit (from the past 72 hour(s)). No results found.    Assessment and Plan: 62 y.o. female with   Acute left-sided thoracic back pain - Plan: traMADol (ULTRAM) 50 MG tablet, naproxen (EC NAPROSYN) 500 MG EC tablet - axial back pain, no red flag symptoms, responded well to Prednisone. Starting Naprosyn 500 mg bid x 2 weeks, cont Tylenol EC prn, Tramadol 50 mg Q6H prn for severe pain - home rehab exercises - encouraged Ice/Heat, TENS unit, okay to continue chiropractor  Patient education and anticipatory guidance given Patient agrees with treatment plan Follow-up with Sports Medicine in 2 weeks or sooner as needed if symptoms worsen or fail to improve  Darlyne Russian PA-C

## 2018-05-24 ENCOUNTER — Ambulatory Visit (INDEPENDENT_AMBULATORY_CARE_PROVIDER_SITE_OTHER): Payer: BLUE CROSS/BLUE SHIELD | Admitting: Family Medicine

## 2018-05-24 ENCOUNTER — Encounter: Payer: Self-pay | Admitting: Family Medicine

## 2018-05-24 DIAGNOSIS — M546 Pain in thoracic spine: Secondary | ICD-10-CM

## 2018-05-24 MED ORDER — NAPROXEN 500 MG PO TBEC: 500.0000 mg | DELAYED_RELEASE_TABLET | Freq: Two times a day (BID) | ORAL | 1 refills | Status: DC | PRN

## 2018-05-24 MED ORDER — CYCLOBENZAPRINE HCL 10 MG PO TABS: 10.0000 mg | ORAL_TABLET | Freq: Two times a day (BID) | ORAL | 1 refills | Status: DC | PRN

## 2018-05-24 NOTE — Patient Instructions (Addendum)
Thank you for coming in today.  Use TENS Attend physical therapy if not better.   Recheck with me in 6-8 weeks if not improving.   Contact me sooner if worsening.   Come back or go to the emergency room if you notice new weakness new numbness problems walking or bowel or bladder problems.  TENS UNIT: This is helpful for muscle pain and spasm.   Search and Purchase a TENS 7000 2nd edition at  www.tenspros.com or www.[email or web address] It should be less than $30.     TENS unit instructions: Do not shower or bathe with the unit on Turn the unit off before removing electrodes or batteries If the electrodes lose stickiness add a drop of water to the electrodes after they are disconnected from the unit and place on plastic sheet. If you continued to have difficulty, call the TENS unit company to purchase more electrodes. Do not apply lotion on the skin area prior to use. Make sure the skin is clean and dry as this will help prolong the life of the electrodes. After use, always check skin for unusual red areas, rash or other skin difficulties. If there are any skin problems, does not apply electrodes to the same area. Never remove the electrodes from the unit by pulling the wires. Do not use the TENS unit or electrodes other than as directed. Do not change electrode placement without consultating your therapist or physician. Keep 2 fingers with between each electrode. Wear time ratio is 2:1, on to off times.    For example on for 30 minutes off for 15 minutes and then on for 30 minutes off for 15 minutes      Lumbosacral Strain Lumbosacral strain is an injury that causes pain in the lower back (lumbosacral spine). This injury usually occurs from overstretching the muscles or ligaments along your spine. A strain can affect one or more muscles or cord-like tissues that connect bones to other bones (ligaments). What are the causes? This condition may be caused by:  A hard, direct hit (blow)  to the back.  Excessive stretching of the lower back muscles. This may result from: ? A fall. ? Lifting something heavy. ? Repetitive movements such as bending or crouching.  What increases the risk? The following factors may increase your risk of getting this condition:  Participating in sports or activities that involve: ? A sudden twist of the back. ? Pushing or pulling motions.  Being overweight or obese.  Having poor strength and flexibility, especially tight hamstrings or weak muscles in the back or abdomen.  Having too much of a curve in the lower back.  Having a pelvis that is tilted forward.  What are the signs or symptoms? The main symptom of this condition is pain in the lower back, at the site of the strain. Pain may extend (radiate) down one or both legs. How is this diagnosed? This condition is diagnosed based on:  Your symptoms.  Your medical history.  A physical exam. ? Your health care provider may push on certain areas of your back to determine the source of your pain. ? You may be asked to bend forward, backward, and side to side to assess the severity of your pain and your range of motion.  Imaging tests, such as: ? X-rays. ? MRI.  How is this treated? Treatment for this condition may include:  Putting heat and cold on the affected area.  Medicines to help relieve pain and relax your muscles (muscle  relaxants).  NSAIDs to help reduce swelling and discomfort.  When your symptoms improve, it is important to gradually return to your normal routine as soon as possible to reduce pain, avoid stiffness, and avoid loss of muscle strength. Generally, symptoms should improve within 6 weeks of treatment. However, recovery time varies. Follow these instructions at home: Managing pain, stiffness, and swelling   If directed, put ice on the injured area during the first 24 hours after your strain. ? Put ice in a plastic bag. ? Place a towel between your  skin and the bag. ? Leave the ice on for 20 minutes, 2-3 times a day.  If directed, put heat on the affected area as often as told by your health care provider. Use the heat source that your health care provider recommends, such as a moist heat pack or a heating pad. ? Place a towel between your skin and the heat source. ? Leave the heat on for 20-30 minutes. ? Remove the heat if your skin turns bright red. This is especially important if you are unable to feel pain, heat, or cold. You may have a greater risk of getting burned. Activity  Rest and return to your normal activities as told by your health care provider. Ask your health care provider what activities are safe for you.  Avoid activities that take a lot of energy for as long as told by your health care provider. General instructions  Take over-the-counter and prescription medicines only as told by your health care provider.  Donot drive or use heavy machinery while taking prescription pain medicine.  Do not use any products that contain nicotine or tobacco, such as cigarettes and e-cigarettes. If you need help quitting, ask your health care provider.  Keep all follow-up visits as told by your health care provider. This is important. How is this prevented?  Use correct form when playing sports and lifting heavy objects.  Use good posture when sitting and standing.  Maintain a healthy weight.  Sleep on a mattress with medium firmness to support your back.  Be safe and responsible while being active to avoid falls.  Do at least 150 minutes of moderate-intensity exercise each week, such as brisk walking or water aerobics. Try a form of exercise that takes stress off your back, such as swimming or stationary cycling.  Maintain physical fitness, including: ? Strength. ? Flexibility. ? Cardiovascular fitness. ? Endurance. Contact a health care provider if:  Your back pain does not improve after 6 weeks of  treatment.  Your symptoms get worse. Get help right away if:  Your back pain is severe.  You cannot stand or walk.  You have difficulty controlling when you urinate or when you have a bowel movement.  You feel nauseous or you vomit.  Your feet get very cold.  You have numbness, tingling, weakness, or problems using your arms or legs.  You develop any of the following: ? Shortness of breath. ? Dizziness. ? Pain in your legs. ? Weakness in your buttocks or legs. ? Discoloration of the skin on your toes or legs. This information is not intended to replace advice given to you by your health care provider. Make sure you discuss any questions you have with your health care provider. Document Released: 06/22/2005 Document Revised: 04/01/2016 Document Reviewed: 02/14/2016 Elsevier Interactive Patient Education  [employer].

## 2018-05-25 ENCOUNTER — Encounter: Payer: Self-pay | Admitting: Family Medicine

## 2018-05-25 NOTE — Progress Notes (Signed)
Subjective:    I'm seeing this patient as a consultation for:  Desiree Dredge, PA-C   CC: back pain  HPI: Patient notes a approximately 6 weeks history of left-sided thoracic and lumbar back pain.  She was in her normal state of health and seen a chiropractor for her hand numbness.  She received a back manipulation and soon thereafter developed significant pain in her thoracic and lumbar spine.  She was seen in urgent care on July 12 where a chest x-ray was unremarkable.  She is had pain waxing and waning off and on since then.  The pain has been severe at times.  On July tells at urgent care she received a prednisone course and some muscle relaxers which helped a bit.  She followed up with her primary care provider on August 6 after a particularly bad episode of lumbar and thoracic back pain and spasms.  She had a trial of naproxen which helped while she was taking it but did not provide lasting benefit.  She was referred to me for evaluation and management.  She notes that since August 6 her back pain has been slowly improving and she is been doing pretty well over the last week.  She notes symptoms are worse with activity but can be quite bothersome at bedtime as well.  She denies any pain radiating down her legs.  She denies any pain radiating along her chest wall either.  She denies any trouble breathing fevers chills nausea vomiting or diarrhea.  Pain is predominantly located at her left lumbar and thoracic back area.  Past medical history, Surgical history, Family history not pertinant except as noted below, Social history, Allergies, and medications have been entered into the medical record, reviewed, and no changes needed.   Review of Systems: No headache, visual changes, nausea, vomiting, diarrhea, constipation, dizziness, abdominal pain, skin rash, fevers, chills, night sweats, weight loss, swollen lymph nodes, body aches, joint swelling, muscle aches, chest pain, shortness of  breath, mood changes, visual or auditory hallucinations.   Objective:    Vitals:   05/24/18 1503  BP: 134/65  Pulse: 61   General: Well Developed, well nourished, and in no acute distress.  Neuro/Psych: Alert and oriented x3, extra-ocular muscles intact, able to move all 4 extremities, sensation grossly intact. Skin: Warm and dry, no rashes noted.  Respiratory: Not using accessory muscles, speaking in full sentences, trachea midline.  Cardiovascular: Pulses palpable, no extremity edema. Abdomen: Does not appear distended. MSK:  T-spine and L-spine are nontender to spinal midline. Patient does have tenderness to palpation along the left lumbar and lower thoracic paraspinal muscle group. Lumbar motion is intact however patient does experience some pain with left rotation and lateral flexion. Lower extremity strength reflexes and sensation are equal normal throughout. Negative slump test bilaterally.  Lab and Radiology Results Chest x-ray images obtained by urgent care on July 12 personally independently reviewed. No acute thoracic spine changes or significant degenerative changes visible.   Impression and Recommendations:    Assessment and Plan: 62 y.o. female with  Pain in the left thoracic and lumbar paraspinal muscle groups.  Pain is waxing and waning but has been improving recently.  Pain is very likely due to myofascial strain and spasm.  Plan to proceed with a home exercise program.  Additionally will use cyclobenzaprine naproxen as needed.  Recommend TENS unit and heating pad.  Additional refer to physical therapy.  Recheck in 6 to 8 weeks.  Return sooner if  needed..   Orders Placed This Encounter  Procedures  . Ambulatory referral to Physical Therapy    Referral Priority:   Routine    Referral Type:   Physical Medicine    Referral Reason:   Specialty Services Required    Requested Specialty:   Physical Therapy   Meds ordered this encounter  Medications  .  cyclobenzaprine (FLEXERIL) 10 MG tablet    Sig: Take 1 tablet (10 mg total) by mouth 2 (two) times daily as needed for muscle spasms.    Dispense:  30 tablet    Refill:  1  . naproxen (EC NAPROSYN) 500 MG EC tablet    Sig: Take 1 tablet (500 mg total) by mouth 2 (two) times daily as needed.    Dispense:  60 tablet    Refill:  1    Discussed warning signs or symptoms. Please see discharge instructions. Patient expresses understanding.

## 2018-09-03 ENCOUNTER — Encounter: Payer: Self-pay | Admitting: Physician Assistant

## 2018-09-03 ENCOUNTER — Ambulatory Visit (INDEPENDENT_AMBULATORY_CARE_PROVIDER_SITE_OTHER): Payer: BLUE CROSS/BLUE SHIELD | Admitting: Physician Assistant

## 2018-09-03 VITALS — BP 134/84 | HR 60 | Temp 97.9°F | Wt 179.0 lb

## 2018-09-03 DIAGNOSIS — R51 Headache: Secondary | ICD-10-CM

## 2018-09-03 DIAGNOSIS — R209 Unspecified disturbances of skin sensation: Secondary | ICD-10-CM | POA: Diagnosis not present

## 2018-09-03 DIAGNOSIS — R202 Paresthesia of skin: Secondary | ICD-10-CM

## 2018-09-03 DIAGNOSIS — R519 Headache, unspecified: Secondary | ICD-10-CM

## 2018-09-03 DIAGNOSIS — M4302 Spondylolysis, cervical region: Secondary | ICD-10-CM

## 2018-09-03 DIAGNOSIS — G509 Disorder of trigeminal nerve, unspecified: Secondary | ICD-10-CM | POA: Diagnosis not present

## 2018-09-03 DIAGNOSIS — N6001 Solitary cyst of right breast: Secondary | ICD-10-CM

## 2018-09-03 DIAGNOSIS — N6002 Solitary cyst of left breast: Secondary | ICD-10-CM

## 2018-09-03 DIAGNOSIS — G8929 Other chronic pain: Secondary | ICD-10-CM

## 2018-09-03 MED ORDER — VALACYCLOVIR HCL 1 G PO TABS: 1000.0000 mg | ORAL_TABLET | Freq: Three times a day (TID) | ORAL | 0 refills | Status: AC

## 2018-09-03 NOTE — Progress Notes (Signed)
HPI:                                                                Desiree Rosario is a 62 y.o. 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  Onset sudden. Symptoms unchanged.  Denies associated headache.  Denies fever, chills, otalgia, hearing changes, tinnitus or rash. Symptoms were preceded by a cough and cold symptoms several weeks ago.    Past Medical History:  Diagnosis Date  . Allergy   . Cervical radiculopathy   . Cervical spondylolysis   . Chronic headaches   . GERD (gastroesophageal reflux disease)   . History of colon polyps   . Hyperlipidemia   . IBS (irritable bowel syndrome)   . Menopausal vasomotor syndrome 10/27/2017  . Obesity   . SCC (squamous cell carcinoma), face    Past Surgical History:  Procedure Laterality Date  . ABLATION    . BREAST LUMPECTOMY     fibroadenomas  . FACET JOINT INJECTION  2018   neck  . LEG SURGERY     RT- fracture  . ROTATOR CUFF REPAIR Right    Social History   Tobacco Use  . Smoking status: Never Smoker  . Smokeless tobacco: Never Used  Substance Use Topics  . Alcohol use: No   family history includes Cancer in her mother; Diabetes in her father; Stroke in her maternal grandmother.    ROS: negative except as noted in the HPI  Medications: Current Outpatient Medications  Medication Sig Dispense Refill  . acetaminophen (TYLENOL) 650 MG CR tablet Take by mouth.    . cetirizine (ZYRTEC) 10 MG tablet Take by mouth.    . colestipol (COLESTID) 1 g tablet Take by mouth.    . cyclobenzaprine (FLEXERIL) 10 MG tablet Take 1 tablet (10 mg total) by mouth  2 (two) times daily as needed for muscle spasms. 30 tablet 1  . dicyclomine (BENTYL) 10 MG capsule Take 1 capsule (10 mg total) by mouth 3 (three) times daily before meals. 90 capsule 1  . fluocinonide cream (LIDEX) 0.05 % APPLY TWICE DAILY TO PSORIASIS AS NEEDED  1  . fluticasone (FLONASE) 50 MCG/ACT nasal spray Place 1 spray into both nostrils daily as needed for allergies or rhinitis. 16 g 0  . hydrOXYzine (ATARAX/VISTARIL) 25 MG tablet Take 1 tablet (25 mg total) by mouth at bedtime. 30 tablet 0  . naproxen (EC NAPROSYN) 500 MG EC tablet Take 1 tablet (500 mg total) by mouth 2 (two) times daily as needed. 60 tablet 1  . pantoprazole (PROTONIX) 40 MG tablet Take 40 mg by mouth daily.    Marland Kitchen PARoxetine (PAXIL) 10 MG tablet Take 1 tablet by mouth daily.  4  . traMADol (ULTRAM) 50 MG tablet Take 1 tablet (50 mg total) by mouth every 6 (six) hours as needed for severe pain. 30 tablet 0  . valACYclovir (VALTREX) 1000 MG tablet Take 1 tablet (1,000  mg total) by mouth 3 (three) times daily for 7 days. 21 tablet 0   No current facility-administered medications for this visit.    No Known Allergies     Objective:  BP 134/84   Pulse 60   Temp 97.9 F (36.6 C) (Oral)   Wt 179 lb (81.2 kg)   BMI 30.25 kg/m  Gen: well-groomed, not ill-appearing, no acute distress HEENT: head normocephalic, atraumatic; conjunctiva and cornea clear, TM's pearly gray and semi-transparent, normal external ear canals bilaterally, oropharynx clear, moist mucus membranes; neck supple, no meningeal signs Pulm: Normal work of breathing, normal phonation, clear to auscultation bilaterally CV: Normal rate, regular rhythm, s1 and s2 distinct, no murmurs, clicks or rubs Neuro:  cranial nerve V abnormality, gross sensation intact in trigeminnal nerve distribution, patient reports difference in sensation along all 3 branches on the right side; other cranial nerves intact, no facial asymmetry MSK: strength 5/5 and symmetric in  bilateral upper and lower extremities, normal gait and station Mental Status: alert and oriented x 3, speech articulate, and thought processes clear and goal-directed  MR BRAIN WO CONTRAST10/22/2018 [facility] Other Result Information  This result has an attachment that is not available.  Result Narrative  CLINICAL DATA:Initial evaluation for chronic headache.  EXAM: MRI HEAD WITHOUT CONTRAST  TECHNIQUE: Multiplanar, multiecho pulse sequences of the brain and surrounding structures were obtained without intravenous contrast.  COMPARISON:None available.  FINDINGS: Brain: Cerebral volume within normal limits for age. He scattered subcentimeter T2/FLAIR hyperintense foci noted within the periventricular and deep white matter both cerebral hemispheres, nonspecific, but felt to be within normal limits for age. No abnormal foci of restricted diffusion to suggest acute or subacute ischemia. Gray-white matter differentiation maintained. No encephalomalacia to suggest chronic infarction. No foci of susceptibility artifact to suggest acute or chronic intracranial hemorrhage.  No mass lesion, midline shift or mass effect. No hydrocephalus. No extra-axial fluid collection. Major dural sinuses are grossly patent.  Pituitary gland within normal limits. Midline structures intact and normal.  Vascular: Major intracranial vascular flow voids maintained.  Skull and upper cervical spine: Craniocervical junction within normal limits. Upper cervical spine normal. Bone marrow signal intensity normal. No scalp soft tissue abnormality.  Sinuses/Orbits: Globes normal soft tissues within normal limits. Paranasal sinuses are clear. No mastoid effusion. Inner ear structures normal.  Other: None.  IMPRESSION: Normal brain MRI for patient age. No acute intracranial abnormality identified.   Electronically Signed By: BenjaminMcClintock M.D. On: 07/17/2017  16:09     No results found for this or any previous visit (from the past 72 hour(s)). No results found.    Assessment and Plan: 62 y.o. female with   .Diagnoses and all orders for this visit:  Disorder of right trigeminal nerve -     Ambulatory referral to Neurology -     valACYclovir (VALTREX) 1000 MG tablet; Take 1 tablet (1,000 mg total) by mouth 3 (three) times daily for 7 days.  Facial paresthesia  Chronic nonintractable headache, unspecified headache type  Cervical spondylolysis  Bilateral breast cysts -     MM Digital Diagnostic Bilat; Future -     US BREAST LTD UNI RIGHT INC AXILLA; Future -     US BREAST LTD UNI LEFT INC AXILLA; Future  Acute facial neuropathy on exam today, gross sensation intact in trigeminal nerve distribution, patient reports difference in sensation along all 3 branches on the right side; Differential includes prodrome of Ramsay Hunt syndrome, trigeminal neuralgia, other trigeminal neuropathy,  migraine variant/headache syndrome  no evidence of rash of ear canal, auricle or oropharynx on exam today. Shingles vaccine is UTD Starting valtrex. Patient advised on possibility of rash developing Referring to Neurology  Also reviewed HM today She is overdue for 6 month interval f/u of bilateral benign and complicated breast cysts Diagnotic mammo and [place] ordered  Patient education and anticipatory guidance given Patient agrees with treatment plan Follow-up as needed if symptoms worsen or fail to improve  Darlyne Russian PA-C

## 2018-09-03 NOTE — Patient Instructions (Signed)
Ramsay Hunt Syndrome °Ramsay Hunt syndrome (RHS) is a viral infection that affects the nerves in the face and the nerves near the inner ear. The infection is caused by the varicella zoster virus (VZV). This is the same virus that causes chicken pox and shingles. °After a person has chicken pox, this virus may become inactive. Years later, the virus can become active again and cause Ramsay Hunt syndrome. The trigger may be something that weakens the body's defense system (immune system), like stress. °When VZV becomes activated, it moves up the facial nerve and causes a painful rash in or around the ear canal. It may also travel up the nerve that supplies hearing. RHS cannot be passed from person to person (is not contagious). However, if a person who has never had chicken pox comes in contact with fluid from someone's skin blisters, he or she may develop chicken pox. °What are the causes? °This condition is caused by the varicella zoster virus. °What increases the risk? °You may be at risk for RHS if you have had chicken pox. °What are the signs or symptoms? °Usually, the first symptom of RHS is deep and severe pain in the affected ear. This is often followed by a rash with blisters that breaks out around the ear. The rash may go into the inner ear, along the side of the face, or up the scalp. Other symptoms may include: °· A rash inside the mouth. °· Severe, burning pain wherever the rash develops. ° °The main symptom of this condition is facial nerve weakness, which may cause: °· Drooping on one side of the face. °· Being unable to close the eyelid on the affected side of the face. °· Having trouble eating. °· Losing the sense of taste on the side of the tongue. ° °If RHS affects the inner ear nerve (auditory nerve), other symptoms may be present, such as: °· Hearing loss. °· A spinning sensation (vertigo). °· Clumsiness. °· Ringing in the ear (tinnitus). ° °How is this diagnosed? °Your health care provider may be  able to diagnose RHS based on your signs and symptoms. You may also have tests to help your health care provider confirm the diagnosis. These tests may include: °· Blood tests to check for antibodies to VZV. Antibodies are proteins that your body produces in response to germs. °· An MRI. °· Nerve conduction studies (electroneurogram). °· Hearing tests (audiology). ° °How is this treated? °RHS will run its course with or without treatment. If treatment starts within the first 3 days of having symptoms, it may shorten the course of RHS and prevent your facial nerve from continuing to weaken. Without treatment, it is possible that you may not recover full use of your facial nerve. You may have to take medicine, including: °· An anti-inflammatory medicine called prednisone. °· An antiviral medicine to treat the virus. °· A prescription pain reliever to control pain. °· Antibiotic medicine, if the rash becomes infected. ° °Follow these instructions at home: °· Take over-the-counter and prescription medicines only as told by your health care provider. °· If you were prescribed an antibiotic medicine, take or apply it as told by your health care provider. Do not stop using the antibiotic even if your condition improves. °· Do not drive or use heavy machinery while taking prescription pain medicine. °· If told by your health care provider, use artificial tears and wear an eye patch to protect your eye until you can close your eyelid again. °· Keep all follow-up   visits as told by your health care provider. This is important. °Get help right away if: °· Your pain medicine is not helping. °· You have chills or fever. °· Your symptoms get worse. °· Your symptoms have not gone away after 2 weeks. °· You have any changes in vision. °This information is not intended to replace advice given to you by your health care provider. Make sure you discuss any questions you have with your health care provider. °Document Released:  09/02/2002 Document Revised: 02/24/2016 Document Reviewed: 12/27/2013 °Elsevier Interactive Patient Education © 2018 [employer] Inc. ° °

## 2018-09-09 ENCOUNTER — Encounter: Payer: Self-pay | Admitting: Physician Assistant

## 2018-09-09 DIAGNOSIS — M4302 Spondylolysis, cervical region: Secondary | ICD-10-CM | POA: Insufficient documentation

## 2018-09-09 DIAGNOSIS — R51 Headache: Secondary | ICD-10-CM

## 2018-09-09 DIAGNOSIS — G509 Disorder of trigeminal nerve, unspecified: Secondary | ICD-10-CM | POA: Insufficient documentation

## 2018-09-09 DIAGNOSIS — R519 Headache, unspecified: Secondary | ICD-10-CM | POA: Insufficient documentation

## 2018-09-09 DIAGNOSIS — R202 Paresthesia of skin: Secondary | ICD-10-CM | POA: Insufficient documentation

## 2018-09-09 DIAGNOSIS — N6001 Solitary cyst of right breast: Secondary | ICD-10-CM | POA: Insufficient documentation

## 2018-09-09 DIAGNOSIS — R209 Unspecified disturbances of skin sensation: Secondary | ICD-10-CM

## 2018-09-09 DIAGNOSIS — N6002 Solitary cyst of left breast: Secondary | ICD-10-CM | POA: Insufficient documentation

## 2018-09-09 DIAGNOSIS — G8929 Other chronic pain: Secondary | ICD-10-CM | POA: Insufficient documentation

## 2018-10-10 ENCOUNTER — Ambulatory Visit: Payer: Self-pay | Admitting: Neurology

## 2018-11-22 ENCOUNTER — Encounter: Payer: Self-pay | Admitting: Physician Assistant

## 2018-11-22 ENCOUNTER — Ambulatory Visit (INDEPENDENT_AMBULATORY_CARE_PROVIDER_SITE_OTHER): Payer: BLUE CROSS/BLUE SHIELD | Admitting: Physician Assistant

## 2018-11-22 VITALS — BP 126/81 | HR 60 | Temp 97.8°F | Wt 178.0 lb

## 2018-11-22 DIAGNOSIS — B9689 Other specified bacterial agents as the cause of diseases classified elsewhere: Secondary | ICD-10-CM

## 2018-11-22 DIAGNOSIS — J019 Acute sinusitis, unspecified: Secondary | ICD-10-CM | POA: Diagnosis not present

## 2018-11-22 DIAGNOSIS — H8301 Labyrinthitis, right ear: Secondary | ICD-10-CM | POA: Diagnosis not present

## 2018-11-22 MED ORDER — AMOXICILLIN-POT CLAVULANATE 875-125 MG PO TABS: 1.0000 | ORAL_TABLET | Freq: Two times a day (BID) | ORAL | 0 refills | Status: DC

## 2018-11-22 MED ORDER — IPRATROPIUM BROMIDE 0.06 % NA SOLN: 2.0000 | Freq: Four times a day (QID) | NASAL | 0 refills | Status: DC | PRN

## 2018-11-22 MED ORDER — PREDNISONE 20 MG PO TABS: 40.0000 mg | ORAL_TABLET | Freq: Every day | ORAL | 0 refills | Status: AC

## 2018-11-22 NOTE — Progress Notes (Signed)
HPI:                                                                Desiree Rosario is a 63 y.o. female who presents to [facility]: [place] today for sinusitis  Sinusitis  This is a new problem. The current episode started 1 to 4 weeks ago (x 2-3 weeks). The problem is unchanged. There has been no fever. The pain is mild. Associated symptoms include congestion, coughing (nonproductive), sinus pressure and a sore throat (PND). Pertinent negatives include no chills, headaches or shortness of breath. Past treatments include nothing.  For the last 2 to 3 days she has had intermittent dizziness.  Endorses right ear fullness and diminished hearing; no hearing loss, tinnitus, otorrhea.  No gait disturbance, lightheadedness or syncope.  Of note, she has been on Methotrexate for about 3 weeks for psoriasis.   Past Medical History:  Diagnosis Date  . Allergy   . Cervical radiculopathy   . Cervical spondylolysis   . Chronic headaches   . GERD (gastroesophageal reflux disease)   . History of colon polyps   . Hyperlipidemia   . IBS (irritable bowel syndrome)   . Menopausal vasomotor syndrome 10/27/2017  . Obesity   . SCC (squamous cell carcinoma), face    Past Surgical History:  Procedure Laterality Date  . ABLATION    . BREAST LUMPECTOMY     fibroadenomas  . FACET JOINT INJECTION  2018   neck  . LEG SURGERY     RT- fracture  . ROTATOR CUFF REPAIR Right    Social History   Tobacco Use  . Smoking status: Never Smoker  . Smokeless tobacco: Never Used  Substance Use Topics  . Alcohol use: No   family history includes Cancer in her mother; Diabetes in her father; Stroke in her maternal grandmother.    ROS: negative except as noted in the HPI  Medications: Current Outpatient Medications  Medication Sig Dispense Refill  . acetaminophen (TYLENOL) 650 MG CR tablet Take by mouth.    . cetirizine (ZYRTEC) 10 MG tablet Take by mouth.    .  colestipol (COLESTID) 1 g tablet Take by mouth.    . cyclobenzaprine (FLEXERIL) 10 MG tablet Take 1 tablet (10 mg total) by mouth 2 (two) times daily as needed for muscle spasms. 30 tablet 1  . dicyclomine (BENTYL) 10 MG capsule Take 1 capsule (10 mg total) by mouth 3 (three) times daily before meals. 90 capsule 1  . fluocinonide cream (LIDEX) 0.05 % APPLY TWICE DAILY TO PSORIASIS AS NEEDED  1  . fluticasone (FLONASE) 50 MCG/ACT nasal spray Place 1 spray into both nostrils daily as needed for allergies or rhinitis. 16 g 0  . hydrOXYzine (ATARAX/VISTARIL) 25 MG tablet Take 1 tablet (25 mg total) by mouth at bedtime. 30 tablet 0  . naproxen (EC NAPROSYN) 500 MG EC tablet Take 1 tablet (500 mg total) by mouth 2 (two) times daily as needed. 60 tablet 1  . pantoprazole (PROTONIX) 40 MG tablet Take 40 mg by mouth daily.    Marland Kitchen PARoxetine (PAXIL) 10 MG tablet Take 1 tablet by mouth daily.  4  . traMADol (ULTRAM) 50 MG tablet Take 1 tablet (50 mg total) by mouth  every 6 (six) hours as needed for severe pain. 30 tablet 0   No current facility-administered medications for this visit.    No Known Allergies     Objective:  BP 126/81   Pulse 60   Temp 97.8 F (36.6 C) (Oral)   Wt 178 lb (80.7 kg)   SpO2 98%   BMI 30.08 kg/m  Gen:  alert, not ill-appearing, no distress, appropriate for age 71: head normocephalic without obvious abnormality, conjunctiva and cornea clear, wearing glasses, no nystagmus, right tympanic membrane with air-fluid level, hearing intact to finger rub nasal mucosa edematous, there is maxillary sinus tenderness, left tonsillar pillar there is a white nodular lesion, neck supple, no cervical adenopathy, trachea midline Pulm: Normal work of breathing, normal phonation, clear to auscultation bilaterally, no wheezes, rales or rhonchi CV: Normal rate, regular rhythm, s1 and s2 distinct, no murmurs, clicks or rubs  Neuro: alert and oriented x 3, no tremor MSK: extremities  atraumatic, normal gait and station Skin: intact, no rashes on exposed skin, no jaundice, no cyanosis   No results found for this or any previous visit (from the past 72 hour(s)). No results found.    Assessment and Plan: 63 y.o. female with   .Sharman was seen today for cough.  Diagnoses and all orders for this visit:  Acute bacterial sinusitis -     amoxicillin-clavulanate (AUGMENTIN) 875-125 MG tablet; Take 1 tablet by mouth 2 (two) times daily. -     predniSONE (DELTASONE) 20 MG tablet; Take 2 tablets (40 mg total) by mouth daily with breakfast for 5 days. -     ipratropium (ATROVENT) 0.06 % nasal spray; Place 2 sprays into both nostrils 4 (four) times daily as needed for rhinitis.  Labyrinthitis of right ear -     predniSONE (DELTASONE) 20 MG tablet; Take 2 tablets (40 mg total) by mouth daily with breakfast for 5 days.   Afebrile, no tachypnea, no tachycardia, pulse ox 98% on room air at rest, no adventitious lung sounds Given persistent nasal signs and symptoms for 2 to 3 weeks we will treat empirically for bacterial sinusitis She is also having symptoms of labyrinthitis related to sinus infection.  Adding prednisone 40 mg daily for 5 days Counseled on supportive care   Patient education and anticipatory guidance given Patient agrees with treatment plan Follow-up as needed if symptoms worsen or fail to improve  Darlyne Russian PA-C

## 2018-11-22 NOTE — Patient Instructions (Addendum)
For nasal symptoms/sinusitis: - nasal saline rinses / netti pot several times per day (do this prior to nasal spray) - prescription Atrovent nasal spray: 2 sprays each nostril, up to 4 times per day as needed - you can use OTC nasal decongestant sprays like Afrin (oxymetazoline) BUT do not use for more than 3 days as it will cause worsening congestion/nasal symptoms) - warm facial compresses - oral decongestants and antihistamines like Claritin-D and Zyrtec-D may help dry up secretions (caution using decongestants if you have high blood pressure, heart disease or kidney disease) - for sinus headache: Tylenol 1000mg  every 8 hours as needed. Alternate with Ibuprofen 600mg  every 6 hours    Labyrinthitis  Labyrinthitis is an inner ear infection. The inner ear is a system of tubes and canals (labyrinth) that are filled with fluid. The inner ear also contains nerve cells that send hearing and balance signals to the brain. When tiny germs (microorganisms) get inside the labyrinth, they harm the cells that send messages to the brain. This can cause changes in hearing and balance. Labyrinthitis usually develops suddenly and goes away with treatment in a few weeks (acute labyrinthitis). If the infection damages parts of the labyrinth, some symptoms may last for a long time (chronic labyrinthitis). What are the causes? Labyrinthitis is most often caused by a virus, such as one that causes:  Infectious mononucleosis, also called "mono."  Measles.  The flu.  Herpes. Labyrinthitis can also be caused by bacteria that spread from an infection in the brain or the middle ear (suppurative labyrinthitis). In some cases, the bacteria may produce a poison (toxin) that gets inside the labyrinth (serous labyrinthitis). What increases the risk? You may be at greater risk for labyrinthitis if you:  Recently had a mouth, nose, or throat infection (upper respiratory infection) or an ear infection.  Drink a lot of  alcohol.  Smoke.  Use certain drugs.  Are not well-rested (are fatigued).  Are experiencing a lot of stress.  Have allergies. What are the signs or symptoms? Symptoms of labyrinthitis usually start suddenly. The symptoms may range from mild to severe, and may include:  Dizziness.  Hearing loss.  A feeling that you or your surroundings are moving when they are not (vertigo).  Ringing in your ear (tinnitus).  Nausea and vomiting.  Trouble focusing your eyes. Symptoms of chronic labyrinthitis may include:  Fatigue.  Confusion.  Hearing loss.  Tinnitus.  Poor balance.  Vertigo after sudden head movements. How is this diagnosed? This condition may be diagnosed based on:  Your symptoms and medical history. Your health care provider may ask about any dizziness or hearing loss you have and any recent upper respiratory infections.  A physical exam that involves: ? Checking your ears for infection. ? Testing your balance. ? Checking your eye movement.  Hearing tests.  Imaging tests, such as CT scan or MRI.  Tests of your eye movements (electronystagmogram, ENG). How is this treated?  Treatment depends on the cause. If your condition is caused by bacteria, you may need antibiotic medicine. If it is caused by a virus, it may get better on its own. Regardless of the cause, you may be treated with:  Medicines to: ? Stop dizziness. ? Relieve nausea. ? Reduce inflammation. ? Speed up your recovery.  Rest. You may be asked to limit your activities until dizziness goes away.  IV fluids. These may be given at a hospital. You may need IV fluids if you have severe nausea and  vomiting.  Physical therapy. A therapist can teach you exercises to help you adjust to feeling dizzy (vestibular rehabilitation exercises). You may need this if you have dizziness that does not go away. Follow these instructions at home: Medicines  Take over-the-counter and prescription  medicines only as told by your health care provider.  If you were prescribed an antibiotic medicine, take it exactly as told by your health care provider. Do not stop taking the antibiotic even if you start to feel better. Activity  Rest as much as possible.  Limit your activity as directed. Ask your health care provider what activities are safe for you.  Do not make sudden movements until any dizziness goes away.  If physical therapy was prescribed, do exercises as directed. General instructions  Avoid loud noises and bright lights.  Do not drive until your health care provider says that this is safe for you.  Drink enough fluid to keep your urine pale yellow.  Keep all follow-up visits as told by your health care provider. This is important. Contact a health care provider if you have:  Symptoms that do not get better with medicine.  Symptoms that last longer than two weeks.  A fever. Get help right away if you have:  Nausea or vomiting that is severe or does not go away.  Severe dizziness.  Sudden hearing loss. Summary  Labyrinthitis is an infection of the inner ear. It can cause changes in hearing and balance.  Symptoms usually start suddenly and include dizziness, hearing loss, and nausea and vomiting. You may also have ringing in your ear (tinnitus), trouble focusing your eyes, and a feeling that you or your surroundings are moving when they are not (vertigo).  If the condition lasts more than a few weeks, symptoms may include fatigue, confusion, hearing loss, poor balance, tinnitus, and vertigo.  Treatment depends on the cause. If your labyrinthitis is caused by bacteria, you may need antibiotic medicine. If your labyrinthitis is caused by a virus, it may get better on its own.  Follow your health care provider's instructions, including how to take medicines, what activities to avoid, and when to get medical help. This information is not intended to replace advice  given to you by your health care provider. Make sure you discuss any questions you have with your health care provider. Document Released: 10/03/2014 Document Revised: 09/23/2017 Document Reviewed: 09/23/2017 Elsevier Interactive Patient Education  2019 [employer] Inc.   Sinusitis, Adult Sinusitis is inflammation of your sinuses. Sinuses are hollow spaces in the bones around your face. Your sinuses are located:  Around your eyes.  In the middle of your forehead.  Behind your nose.  In your cheekbones. Mucus normally drains out of your sinuses. When your nasal tissues become inflamed or swollen, mucus can become trapped or blocked. This allows bacteria, viruses, and fungi to grow, which leads to infection. Most infections of the sinuses are caused by a virus. Sinusitis can develop quickly. It can last for up to 4 weeks (acute) or for more than 12 weeks (chronic). Sinusitis often develops after a cold. What are the causes? This condition is caused by anything that creates swelling in the sinuses or stops mucus from draining. This includes:  Allergies.  Asthma.  Infection from bacteria or viruses.  Deformities or blockages in your nose or sinuses.  Abnormal growths in the nose (nasal polyps).  Pollutants, such as chemicals or irritants in the air.  Infection from fungi (rare). What increases the risk? You  are more likely to develop this condition if you:  Have a weak body defense system (immune system).  Do a lot of swimming or diving.  Overuse nasal sprays.  Smoke. What are the signs or symptoms? The main symptoms of this condition are pain and a feeling of pressure around the affected sinuses. Other symptoms include:  Stuffy nose or congestion.  Thick drainage from your nose.  Swelling and warmth over the affected sinuses.  Headache.  Upper toothache.  A cough that may get worse at night.  Extra mucus that collects in the throat or the back of the nose  (postnasal drip).  Decreased sense of smell and taste.  Fatigue.  A fever.  Sore throat.  Bad breath. How is this diagnosed? This condition is diagnosed based on:  Your symptoms.  Your medical history.  A physical exam.  Tests to find out if your condition is acute or chronic. This may include: ? Checking your nose for nasal polyps. ? Viewing your sinuses using a device that has a light (endoscope). ? Testing for allergies or bacteria. ? Imaging tests, such as an MRI or CT scan. In rare cases, a bone biopsy may be done to rule out more serious types of fungal sinus disease. How is this treated? Treatment for sinusitis depends on the cause and whether your condition is chronic or acute.  If caused by a virus, your symptoms should go away on their own within 10 days. You may be given medicines to relieve symptoms. They include: ? Medicines that shrink swollen nasal passages (topical intranasal decongestants). ? Medicines that treat allergies (antihistamines). ? A spray that eases inflammation of the nostrils (topical intranasal corticosteroids). ? Rinses that help get rid of thick mucus in your nose (nasal saline washes).  If caused by bacteria, your health care provider may recommend waiting to see if your symptoms improve. Most bacterial infections will get better without antibiotic medicine. You may be given antibiotics if you have: ? A severe infection. ? A weak immune system.  If caused by narrow nasal passages or nasal polyps, you may need to have surgery. Follow these instructions at home: Medicines  Take, use, or apply over-the-counter and prescription medicines only as told by your health care provider. These may include nasal sprays.  If you were prescribed an antibiotic medicine, take it as told by your health care provider. Do not stop taking the antibiotic even if you start to feel better. Hydrate and humidify   Drink enough fluid to keep your urine pale  yellow. Staying hydrated will help to thin your mucus.  Use a cool mist humidifier to keep the humidity level in your home above 50%.  Inhale steam for 10-15 minutes, 3-4 times a day, or as told by your health care provider. You can do this in the bathroom while a hot shower is running.  Limit your exposure to cool or dry air. Rest  Rest as much as possible.  Sleep with your head raised (elevated).  Make sure you get enough sleep each night. General instructions   Apply a warm, moist washcloth to your face 3-4 times a day or as told by your health care provider. This will help with discomfort.  Wash your hands often with soap and water to reduce your exposure to germs. If soap and water are not available, use hand sanitizer.  Do not smoke. Avoid being around people who are smoking (secondhand smoke).  Keep all follow-up visits as told by  your health care provider. This is important. Contact a health care provider if:  You have a fever.  Your symptoms get worse.  Your symptoms do not improve within 10 days. Get help right away if:  You have a severe headache.  You have persistent vomiting.  You have severe pain or swelling around your face or eyes.  You have vision problems.  You develop confusion.  Your neck is stiff.  You have trouble breathing. Summary  Sinusitis is soreness and inflammation of your sinuses. Sinuses are hollow spaces in the bones around your face.  This condition is caused by nasal tissues that become inflamed or swollen. The swelling traps or blocks the flow of mucus. This allows bacteria, viruses, and fungi to grow, which leads to infection.  If you were prescribed an antibiotic medicine, take it as told by your health care provider. Do not stop taking the antibiotic even if you start to feel better.  Keep all follow-up visits as told by your health care provider. This is important. This information is not intended to replace advice given to  you by your health care provider. Make sure you discuss any questions you have with your health care provider. Document Released: 09/12/2005 Document Revised: 02/12/2018 Document Reviewed: 02/12/2018 Elsevier Interactive Patient Education  2019 [employer].

## 2018-11-27 ENCOUNTER — Encounter: Payer: Self-pay | Admitting: Physician Assistant

## 2018-11-27 DIAGNOSIS — H8301 Labyrinthitis, right ear: Secondary | ICD-10-CM | POA: Insufficient documentation

## 2018-12-14 ENCOUNTER — Other Ambulatory Visit: Payer: Self-pay | Admitting: Physician Assistant

## 2018-12-14 DIAGNOSIS — B9689 Other specified bacterial agents as the cause of diseases classified elsewhere: Secondary | ICD-10-CM

## 2018-12-14 DIAGNOSIS — J019 Acute sinusitis, unspecified: Principal | ICD-10-CM

## 2018-12-28 ENCOUNTER — Other Ambulatory Visit: Payer: Self-pay | Admitting: Physician Assistant

## 2018-12-28 DIAGNOSIS — J019 Acute sinusitis, unspecified: Principal | ICD-10-CM

## 2018-12-28 DIAGNOSIS — B9689 Other specified bacterial agents as the cause of diseases classified elsewhere: Secondary | ICD-10-CM

## 2019-01-07 ENCOUNTER — Other Ambulatory Visit: Payer: Self-pay

## 2019-01-07 ENCOUNTER — Encounter: Payer: Self-pay | Admitting: Sports Medicine

## 2019-01-07 ENCOUNTER — Ambulatory Visit (INDEPENDENT_AMBULATORY_CARE_PROVIDER_SITE_OTHER): Payer: BLUE CROSS/BLUE SHIELD | Admitting: Sports Medicine

## 2019-01-07 DIAGNOSIS — H938X2 Other specified disorders of left ear: Secondary | ICD-10-CM

## 2019-01-07 MED ORDER — PREDNISONE 50 MG PO TABS: ORAL_TABLET | ORAL | 0 refills | Status: DC

## 2019-01-07 NOTE — Assessment & Plan Note (Signed)
Enterocolitis, with significant pruritus, suspect allergic cause. Exam is benign with the exception of bit of erythema and swelling on the tragus. Adding 5 days of prednisone. Return in a week, if no better we will get a CT.

## 2019-01-07 NOTE — Progress Notes (Signed)
Subjective:    CC: Ear pain  HPI: For the past several days this pleasant 63 year old female has noted swelling, itching, and pain, as well as mild redness around the tragus of her left ear.  No constitutional symptoms, no GI symptoms, no skin rash, no trauma, no change in hearing.  Symptoms are moderate, persistent.  Localized without radiation.  I reviewed the past medical history, family history, social history, surgical history, and allergies today and no changes were needed.  Please see the problem list section below in epic for further details.  Past Medical History: Past Medical History:  Diagnosis Date  . Allergy   . Cervical radiculopathy   . Cervical spondylolysis   . Chronic headaches   . GERD (gastroesophageal reflux disease)   . History of colon polyps   . Hyperlipidemia   . IBS (irritable bowel syndrome)   . Menopausal vasomotor syndrome 10/27/2017  . Obesity   . SCC (squamous cell carcinoma), face    Past Surgical History: Past Surgical History:  Procedure Laterality Date  . ABLATION    . BREAST LUMPECTOMY     fibroadenomas  . FACET JOINT INJECTION  2018   neck  . LEG SURGERY     RT- fracture  . ROTATOR CUFF REPAIR Right    Social History: Social History   Socioeconomic History  . Marital status: Married    Spouse name: Not on file  . Number of children: Not on file  . Years of education: Not on file  . Highest education level: Not on file  Occupational History  . Not on file  Social Needs  . Financial resource strain: Not on file  . Food insecurity:    Worry: Not on file    Inability: Not on file  . Transportation needs:    Medical: Not on file    Non-medical: Not on file  Tobacco Use  . Smoking status: Never Smoker  . Smokeless tobacco: Never Used  Substance and Sexual Activity  . Alcohol use: No  . Drug use: No  . Sexual activity: Not Currently  Lifestyle  . Physical activity:    Days per week: Not on file    Minutes per session: Not  on file  . Stress: Not on file  Relationships  . Social connections:    Talks on phone: Not on file    Gets together: Not on file    Attends religious service: Not on file    Active member of club or organization: Not on file    Attends meetings of clubs or organizations: Not on file    Relationship status: Not on file  Other Topics Concern  . Not on file  Social History Narrative  . Not on file   Family History: Family History  Problem Relation Age of Onset  . Cancer Mother        melanoma  . Diabetes Father   . Stroke Maternal Grandmother    Allergies: No Known Allergies Medications: See med rec.  Review of Systems: No fevers, chills, night sweats, weight loss, chest pain, or shortness of breath.   Objective:    General: Well Developed, well nourished, and in no acute distress.  Neuro: Alert and oriented x3, extra-ocular muscles intact, sensation grossly intact.  HEENT: Normocephalic, atraumatic, pupils equal round reactive to light, neck supple, no masses, no lymphadenopathy, thyroid nonpalpable.  Oropharynx, nasopharynx, ear canals unremarkable, tympanic membrane unremarkable, there is minimal fullness and mild erythema of the tragus on the  left.  No mastoid tenderness, no preauricular, or cervical lymphadenopathy. Skin: Warm and dry, no rashes. Cardiac: Regular rate and rhythm, no murmurs rubs or gallops, no lower extremity edema.  Respiratory: Clear to auscultation bilaterally. Not using accessory muscles, speaking in full sentences.  Impression and Recommendations:    Swelling of ear, left Enterocolitis, with significant pruritus, suspect allergic cause. Exam is benign with the exception of bit of erythema and swelling on the tragus. Adding 5 days of prednisone. Return in a week, if no better we will get a CT.   ___________________________________________ Gwen Her. Dianah Field, M.D., ABFM., CAQSM. Primary Care and Sports Medicine [facility]  [facility]  Adjunct Professor of [employer]

## 2019-05-14 ENCOUNTER — Emergency Department (INDEPENDENT_AMBULATORY_CARE_PROVIDER_SITE_OTHER)
Admission: EM | Admit: 2019-05-14 | Discharge: 2019-05-14 | Disposition: A | Discharge status: Home / Self Care | Payer: BC Managed Care – PPO | Source: Home / Self Care

## 2019-05-14 ENCOUNTER — Encounter: Payer: Self-pay | Admitting: *Deleted

## 2019-05-14 ENCOUNTER — Other Ambulatory Visit: Payer: Self-pay

## 2019-05-14 DIAGNOSIS — M26621 Arthralgia of right temporomandibular joint: Secondary | ICD-10-CM

## 2019-05-14 DIAGNOSIS — Z20822 Contact with and (suspected) exposure to covid-19: Secondary | ICD-10-CM

## 2019-05-14 DIAGNOSIS — Z20828 Contact with and (suspected) exposure to other viral communicable diseases: Secondary | ICD-10-CM

## 2019-05-14 NOTE — ED Provider Notes (Signed)
[name]    CSN: [id number] Arrival date & time: 05/14/19  6295     History   Chief Complaint Chief Complaint  Patient presents with  . Jaw Pain  . exposure to covid, request testing    HPI Desiree Rosario is a 63 y.o. female.   HPI Desiree Rosario is a 63 y.o. female presenting to UC with request to be tested for Covid-19 after being in close contact, helping care for her brother-in-law last week, who tested positive for Covid-19. Last contact with her brother-in-law was 3 days ago. Pt denies fever, chills, cough, congestion, n/v/d. No change in taste or smell. Pt's husband is also here for testing and is asymptomatic.  Pt also c/o unrelated Right side jaw pain that started a few weeks ago after having 2 crowns placed on her Right lower molars.  Pain has gradually improves and temporarily resolves with acetaminophen but pt is concerned she is still having soreness. Pain is mild at this time. She has not mentioned this pain to her dentist yet, hoping it would resolve on its own.    Past Medical History:  Diagnosis Date  . Allergy   . Cervical radiculopathy   . Cervical spondylolysis   . Chronic headaches   . GERD (gastroesophageal reflux disease)   . History of colon polyps   . Hyperlipidemia   . IBS (irritable bowel syndrome)   . Menopausal vasomotor syndrome 10/27/2017  . Obesity   . SCC (squamous cell carcinoma), face     Patient Active Problem List   Diagnosis Date Noted  . Swelling of ear, left 01/07/2019  . Labyrinthitis of right ear 11/27/2018  . 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  . Facial paresthesia 09/09/2018  . Bilateral breast cysts 09/09/2018  . Chronic headaches   . Cervical spondylolysis   . Acute left-sided thoracic back pain 05/01/2018  . Upper airway cough syndrome 10/27/2017  . Globus sensation 10/27/2017  . Menopausal vasomotor syndrome 10/27/2017  . Carpal tunnel syndrome, left 09/15/2017  . Tendinitis of flexor tendon  of left hand 03/06/2017  . Irritable bowel syndrome with both constipation and diarrhea 02/10/2017  . Sacral back pain 02/10/2017  . Tubular adenoma of colon 02/10/2017  . Cervical spondylosis 12/02/2016  . Hyperlipidemia 11/23/2015  . 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 History of colonic polyps 11/20/2015  . Allergic rhinitis 11/13/2015  . Bilateral foot pain 06/13/2013    Past Surgical History:  Procedure Laterality Date  . ABLATION    . BREAST LUMPECTOMY     fibroadenomas  . FACET JOINT INJECTION  2018   neck  . LEG SURGERY     RT- fracture  . ROTATOR CUFF REPAIR Right     OB History   No obstetric history on file.      Home Medications    Prior to Admission medications   Medication Sig Start Date End Date Taking? Authorizing Provider  acetaminophen (TYLENOL) 650 MG CR tablet Take by mouth. 04/03/17   [provider]  cetirizine (ZYRTEC) 10 MG tablet Take by mouth.    [provider]  colestipol (COLESTID) 1 g tablet Take by mouth. 08/03/17   [provider]  cyclobenzaprine (FLEXERIL) 10 MG tablet Take 1 tablet (10 mg total) by mouth 2 (two) times daily as needed for muscle spasms. 05/24/18   Gregor Hams, MD  dicyclomine (BENTYL) 10 MG capsule Take 1 capsule (10 mg total) by mouth 3 (three) times daily before meals.  02/09/17   Trixie Dredge, PA-C  fluocinonide cream (LIDEX) 0.05 % APPLY TWICE DAILY TO PSORIASIS AS NEEDED 07/27/17   [provider]  fluticasone (FLONASE) 50 MCG/ACT nasal spray Place 1 spray into both nostrils daily as needed for allergies or rhinitis. 10/27/17   Trixie Dredge, PA-C  folic acid (FOLVITE) 1 MG tablet Take 1 mg by mouth daily. 11/05/18   [provider]  hydrOXYzine (ATARAX/VISTARIL) 25 MG tablet Take 1 tablet (25 mg total) by mouth at bedtime. 10/27/17   Trixie Dredge, PA-C  ipratropium (ATROVENT) 0.06 % nasal spray PLACE 2 SPRAYS INTO BOTH NOSTRILS 4  (FOUR) TIMES DAILY AS NEEDED FOR RHINITIS. 12/31/18   Trixie Dredge, PA-C  methotrexate (RHEUMATREX) 2.5 MG tablet TAKE 6 TABLETS EVERY WEEK ON THE SAME DAY 11/05/18   [provider]  naproxen (EC NAPROSYN) 500 MG EC tablet Take 1 tablet (500 mg total) by mouth 2 (two) times daily as needed. 05/24/18   Gregor Hams, MD  pantoprazole (PROTONIX) 40 MG tablet Take 40 mg by mouth daily.    [provider]  PARoxetine (PAXIL) 10 MG tablet Take 1 tablet by mouth daily. 09/22/17   [provider]  RESTASIS 0.05 % ophthalmic emulsion INSTILL 1 DROP(S) IN EACH EYE BY OPHTHALMIC ROUTE 2 TIME(S) PER DAY 10/28/18   [provider]    Family History Family History  Problem Relation Age of Onset  . Cancer Mother        melanoma  . Diabetes Father   . Stroke Maternal Grandmother     Social History Social History   Tobacco Use  . Smoking status: Never Smoker  . Smokeless tobacco: Never Used  Substance Use Topics  . Alcohol use: No  . Drug use: No     Allergies   Patient has no known allergies.   Review of Systems Review of Systems  Constitutional: Negative for chills and fever.  HENT: Positive for dental problem. Negative for congestion, ear pain, sore throat, trouble swallowing and voice change.   Respiratory: Negative for cough and shortness of breath.   Cardiovascular: Negative for chest pain and palpitations.  Gastrointestinal: Negative for abdominal pain, diarrhea, nausea and vomiting.  Musculoskeletal: Negative for arthralgias, back pain and myalgias.  Skin: Negative for rash.  Neurological: Negative for dizziness, light-headedness and headaches.     Physical Exam Triage Vital Signs ED Triage Vitals  Enc Vitals Group     BP      Pulse      Resp      Temp      Temp src      SpO2      Weight      Height      Head Circumference      Peak Flow      Pain Score      Pain Loc      Pain Edu?      Excl. in [place]?    No data  found.  Updated Vital Signs BP 132/72 (BP Location: Right Arm)   Pulse 64   Temp 98.1 F (36.7 C) (Oral)   Resp 18   SpO2 100%   Visual Acuity Right Eye Distance:   Left Eye Distance:   Bilateral Distance:    Right Eye Near:   Left Eye Near:    Bilateral Near:     Physical Exam Vitals signs and nursing note reviewed.  Constitutional:      Appearance:  Normal appearance. She is well-developed.  HENT:     Head: Normocephalic and atraumatic.     Jaw: There is normal jaw occlusion. Tenderness ( mild over Right TMJ, no crepitus or deformity) present. No trismus, swelling or pain on movement.     Right Ear: Tympanic membrane, ear canal and external ear normal.     Left Ear: Tympanic membrane, ear canal and external ear normal.     Nose: Nose normal.     Right Sinus: No maxillary sinus tenderness or frontal sinus tenderness.     Left Sinus: No maxillary sinus tenderness or frontal sinus tenderness.     Mouth/Throat:     Lips: Pink.     Mouth: Mucous membranes are moist.     Pharynx: Oropharynx is clear. Uvula midline.  Eyes:     Extraocular Movements: Extraocular movements intact.     Conjunctiva/sclera: Conjunctivae normal.  Neck:     Musculoskeletal: Normal range of motion.  Cardiovascular:     Rate and Rhythm: Normal rate and regular rhythm.  Pulmonary:     Effort: Pulmonary effort is normal.     Breath sounds: Normal breath sounds.  Musculoskeletal: Normal range of motion.  Skin:    General: Skin is warm and dry.     Findings: No rash.  Neurological:     Mental Status: She is alert and oriented to person, place, and time.  Psychiatric:        Behavior: Behavior normal.      UC Treatments / Results  Labs (all labs ordered are listed, but only abnormal results are displayed) Labs Reviewed  NOVEL CORONAVIRUS, NAA    EKG   Radiology No results found.  Procedures Procedures (including critical care time)  Medications Ordered in UC Medications - No data  to display  Initial Impression / Assessment and Plan / UC Course  I have reviewed the triage vital signs and the nursing notes.  Pertinent labs & imaging results that were available during my care of the patient were reviewed by me and considered in my medical decision making (see chart for details).    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  Nasal swab for Covid-19 test sent to lab.  AVS provided.  Final Clinical Impressions(s) / UC Diagnoses   Final diagnoses:  Close Exposure to Covid-19 Virus  TMJ tenderness, right     Discharge Instructions       Due to concern for possibly having Covid-19, it is advised that you self-isolate at home until test results come back.  If positive, it is recommended you stay isolated for at least 10 days after symptom onset and 24 hours after last fever without taking medication (whichever is longer).  If you MUST go out, please wear a mask at all times, limit contact with others.   If you develop symptoms while waiting on test results, please let our office know as this may require extended quarantine and/or retesting as it may take up to 2 weeks post-exposure to develop symptoms and have accurate test results.     ED Prescriptions    None     Controlled Substance Prescriptions [place] Controlled Substance Registry consulted? Not Applicable   Tyrell Antonio 05/14/19 [id number]

## 2019-05-14 NOTE — Discharge Instructions (Addendum)
° °  Due to concern for possibly having Covid-19, it is advised that you self-isolate at home until test results come back.  If positive, it is recommended you stay isolated for at least 10 days after symptom onset and 24 hours after last fever without taking medication (whichever is longer).  If you MUST go out, please wear a mask at all times, limit contact with others.   If you develop symptoms while waiting on test results, please let our office know as this may require extended quarantine and/or retesting as it may take up to 2 weeks post-exposure to develop symptoms and have accurate test results.

## 2019-05-14 NOTE — ED Triage Notes (Signed)
Pt c/o jaw pain. She also reports COVID exposure 3 days ago. Denies fever or COVID symptoms.

## 2019-05-16 ENCOUNTER — Telehealth: Payer: Self-pay

## 2019-05-16 LAB — NOVEL CORONAVIRUS, NAA: SARS-CoV-2, NAA: NOT DETECTED

## 2019-05-16 NOTE — Telephone Encounter (Signed)
Notified patient of Negative COVID results.  Will follow up as needed.

## 2019-08-20 ENCOUNTER — Other Ambulatory Visit: Payer: Self-pay

## 2019-08-20 LAB — HM PAP SMEAR

## 2019-08-20 LAB — HM MAMMOGRAPHY

## 2019-08-21 ENCOUNTER — Encounter: Payer: Self-pay | Admitting: Internal Medicine

## 2019-08-21 ENCOUNTER — Ambulatory Visit (INDEPENDENT_AMBULATORY_CARE_PROVIDER_SITE_OTHER): Payer: BC Managed Care – PPO | Admitting: Internal Medicine

## 2019-08-21 VITALS — BP 118/49 | HR 100 | Temp 96.8°F | Resp 12 | Ht 65.0 in | Wt 171.1 lb

## 2019-08-21 DIAGNOSIS — E785 Hyperlipidemia, unspecified: Secondary | ICD-10-CM

## 2019-08-21 DIAGNOSIS — K219 Gastro-esophageal reflux disease without esophagitis: Secondary | ICD-10-CM | POA: Diagnosis not present

## 2019-08-21 DIAGNOSIS — J309 Allergic rhinitis, unspecified: Secondary | ICD-10-CM | POA: Diagnosis not present

## 2019-08-21 DIAGNOSIS — K582 Mixed irritable bowel syndrome: Secondary | ICD-10-CM | POA: Diagnosis not present

## 2019-08-21 DIAGNOSIS — L409 Psoriasis, unspecified: Secondary | ICD-10-CM

## 2019-08-21 MED ORDER — AZELASTINE HCL 0.1 % NA SOLN: 2.0000 | Freq: Every evening | NASAL | 6 refills | Status: AC | PRN

## 2019-08-21 NOTE — Patient Instructions (Addendum)
  GO TO THE FRONT DESK Schedule blood work to be done next week, fasting  Schedule a physical exam to be done in 6 to 8 months   For allergies: Over-the-counter Flonase 2 sprays on each side of the nose in the morning  Astelin 2 sprays on each side of the nose at nighttime, prescription sent

## 2019-08-21 NOTE — Progress Notes (Signed)
Subjective:    Patient ID: Desiree Rosario, female    DOB: 03/30/1956, 63 y.o.   MRN: [id number]  DOS:  08/21/2019 Type of visit - description: New patient Here to get established. Chart is reviewed. Currently one of her concerns is allergies, she has postnasal dripping and the need to constantly clearing her throat.  This is going on for a while, has not taking allergy medications in a long time. IBS: Improved lately Psoriasis: Sees dermatology regularly, on MTX. Neck pain: Chronic issue, not taking any strong pain medication.  Review of Systems No fever chills lately No cough, chest congestion or wheezing.   Past Medical History:  Diagnosis Date  . Allergy   . Cervical radiculopathy   . Cervical spondylolysis   . Chronic headaches   . GERD (gastroesophageal reflux disease)   . History of colon polyps   . Hyperlipidemia   . IBS (irritable bowel syndrome)   . Menopausal vasomotor syndrome 10/27/2017  . Obesity   . SCC (squamous cell carcinoma), face     Past Surgical History:  Procedure Laterality Date  . ABLATION    . BREAST LUMPECTOMY     fibroadenomas  . FACET JOINT INJECTION  2018   neck  . LEG SURGERY     RT- fracture  . ROTATOR CUFF REPAIR Right     Social History   Socioeconomic History  . Marital status: Married    Spouse name: Not on file  . Number of children: 2  . Years of education: Not on file  . Highest education level: Not on file  Occupational History  . 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 Financial resource strain: Not on file  . Food insecurity    Worry: Not on file    Inability: Not on file  . Transportation needs    Medical: Not on file    Non-medical: Not on file  Tobacco Use  . Smoking status: Never Smoker  . Smokeless tobacco: Never Used  Substance and Sexual Activity  . Alcohol use: No  . Drug use: No  . Sexual activity: Not Currently  Lifestyle  . Physical activity    Days per week: Not on file    Minutes per  session: Not on file  . Stress: Not on file  Relationships  . Social Herbalist on phone: Not on file    Gets together: Not on file    Attends religious service: Not on file    Active member of club or organization: Not on file    Attends meetings of clubs or organizations: Not on file    Relationship status: Not on file  . Intimate partner violence    Fear of current or ex partner: Not on file    Emotionally abused: Not on file    Physically abused: Not on file    Forced sexual activity: Not on file  Other Topics Concern  . Not on file  Social History Narrative   Household: Pt and husband     Family History  Problem Relation Age of Onset  . Cancer Mother        melanoma  . Diabetes Father   . Stroke Maternal Grandmother   . Colon cancer Neg Hx   . Breast cancer Neg Hx   . CAD Neg Hx      Allergies as of 08/21/2019   No Known Allergies     Medication List  Accurate as of August 21, 2019 11:59 PM. If you have any questions, ask your nurse or doctor.        STOP taking these medications   colestipol 1 g tablet Commonly known as: COLESTID Stopped by: Kathlene November, MD   cyclobenzaprine 10 MG tablet Commonly known as: FLEXERIL Stopped by: Kathlene November, MD   dicyclomine 10 MG capsule Commonly known as: Bentyl Stopped by: Kathlene November, MD   fluocinonide cream 0.05 % Commonly known as: [place] by: Kathlene November, MD   fluticasone 50 MCG/ACT nasal spray Commonly known as: FLONASE Stopped by: Kathlene November, MD   hydrOXYzine 25 MG tablet Commonly known as: ATARAX/VISTARIL Stopped by: Kathlene November, MD   ipratropium 0.06 % nasal spray Commonly known as: ATROVENT Stopped by: Kathlene November, MD   naproxen 500 MG EC tablet Commonly known as: EC NAPROSYN Stopped by: Kathlene November, MD     TAKE these medications   acetaminophen 650 MG CR tablet Commonly known as: TYLENOL Take by mouth.   azelastine 0.1 % nasal spray Commonly known as: ASTELIN Place 2 sprays into  both nostrils at bedtime as needed for rhinitis. Use in each nostril as directed Started by: Kathlene November, MD   cetirizine 10 MG tablet Commonly known as: ZYRTEC Take by mouth.   folic acid 1 MG tablet Commonly known as: FOLVITE Take 1 mg by mouth daily.   methotrexate 2.5 MG tablet Commonly known as: RHEUMATREX TAKE 6 TABLETS EVERY WEEK ON THE SAME DAY   pantoprazole 40 MG tablet Commonly known as: PROTONIX Take 40 mg by mouth daily.   PARoxetine 10 MG tablet Commonly known as: PAXIL Take 1 tablet by mouth daily.   Restasis 0.05 % ophthalmic emulsion Generic drug: cycloSPORINE INSTILL 1 DROP(S) IN EACH EYE BY OPHTHALMIC ROUTE 2 TIME(S) PER DAY           Objective:   Physical Exam BP (!) 118/49 (BP Location: Right Arm, Cuff Size: Normal)   Pulse 100   Temp (!) 96.8 F (36 C) (Temporal)   Resp 12   Ht 5\' 5"  (1.651 m)   Wt 171 lb 1.6 oz (77.6 kg)   SpO2 (!) 55%   BMI 28.47 kg/m  General:   Well developed, NAD, BMI noted. HEENT:  Normocephalic . Face symmetric, atraumatic. TMs: Slightly bulged but not red. Nose minimal congestion, no TTP at the sinus area Lungs:  CTA B Normal respiratory effort, no intercostal retractions, no accessory muscle use. Heart: RRR,  no murmur.  No pretibial edema bilaterally  Skin: Not pale. Not jaundice Neurologic:  alert & oriented X3.  Speech normal, gait appropriate for age and unassisted Psych--  Cognition and judgment appear intact.  Cooperative with normal attention span and concentration.  Behavior appropriate. No anxious or depressed appearing.      Assessment    ASSESSMENT new patient 07-2019, referred by husband Lavan Hyperlipidemia GERD, IBS-diarrhea predominant, Dermatology, Dr Melina Copa --Psoriasis (MTX) --Skin cancer, SCC MSK:  History of neck pain, has seen pain management before Menopausal: on paxil  PLAN Allergies: As described above, recommend consistent use of Flonase and Astelin, prescription  sent Hyperlipidemia: Currently diet controlled, will check a FLP, TSH and BMP GERD-IBS:Diarrhea predominant, GI is at [facility], previously failed several agents, colestipol help when prescribing 2019.  Interestingly, since she started MTX and folic acid GI symptoms are decrease. Psoriasis: On MTX, monitored carefully by dermatology per patient. Preventive care reviewed: RTC for labs next week RTC CPX 6 to 8  months   This visit occurred during the SARS-CoV-2 public health emergency.  Safety protocols were in place, including screening questions prior to the visit, additional usage of staff PPE, and extensive cleaning of exam room while observing appropriate contact time as indicated for disinfecting solutions.

## 2019-08-23 DIAGNOSIS — Z0000 Encounter for general adult medical examination without abnormal findings: Secondary | ICD-10-CM | POA: Insufficient documentation

## 2019-08-23 DIAGNOSIS — Z09 Encounter for follow-up examination after completed treatment for conditions other than malignant neoplasm: Secondary | ICD-10-CM | POA: Insufficient documentation

## 2019-08-23 DIAGNOSIS — K219 Gastro-esophageal reflux disease without esophagitis: Secondary | ICD-10-CM | POA: Insufficient documentation

## 2019-08-23 DIAGNOSIS — L409 Psoriasis, unspecified: Secondary | ICD-10-CM | POA: Insufficient documentation

## 2019-08-23 NOTE — Assessment & Plan Note (Signed)
This is not her physical exam but we review her preventive care: Td 2019 Status post Shingrix Had a flu shot See gynecology regularly Had a colonoscopy in 2016 and a sigmoidoscopy 12/2019er preventive care:

## 2019-08-23 NOTE — Assessment & Plan Note (Signed)
Allergies: As described above, recommend consistent use of Flonase and Astelin, prescription sent Hyperlipidemia: Currently diet controlled, will check a FLP, TSH and BMP GERD-IBS:Diarrhea predominant, GI is at [facility], previously failed several agents, colestipol help when prescribing 2019.  Interestingly, since she started MTX and folic acid GI symptoms are decrease. Psoriasis: On MTX, monitored carefully by dermatology per patient. Preventive care reviewed: RTC for labs next week RTC CPX 6 to 8 months

## 2019-09-02 ENCOUNTER — Encounter: Payer: Self-pay | Admitting: Internal Medicine

## 2019-09-06 ENCOUNTER — Other Ambulatory Visit (INDEPENDENT_AMBULATORY_CARE_PROVIDER_SITE_OTHER): Payer: BC Managed Care – PPO

## 2019-09-06 ENCOUNTER — Other Ambulatory Visit: Payer: Self-pay

## 2019-09-06 DIAGNOSIS — E785 Hyperlipidemia, unspecified: Secondary | ICD-10-CM

## 2019-09-06 LAB — LIPID PANEL
Cholesterol: 227 mg/dL — ABNORMAL HIGH (ref 0–200)
HDL: 66.3 mg/dL (ref >39.00)
LDL Cholesterol: 146 mg/dL — ABNORMAL HIGH (ref 0–99)
NonHDL: 160.88
Total CHOL/HDL Ratio: 3
Triglycerides: 74 mg/dL (ref 0.0–149.0)
VLDL: 14.8 mg/dL (ref 0.0–40.0)

## 2019-09-06 LAB — BASIC METABOLIC PANEL
BUN: 16 mg/dL (ref 6–23)
CO2: 29 mEq/L (ref 19–32)
Calcium: 9.6 mg/dL (ref 8.4–10.5)
Chloride: 104 mEq/L (ref 96–112)
Creatinine, Ser: 0.9 mg/dL (ref 0.40–1.20)
GFR: 63.15 mL/min (ref >60.00)
Glucose, Bld: 99 mg/dL (ref 70–99)
Potassium: 5.1 mEq/L (ref 3.5–5.1)
Sodium: 140 mEq/L (ref 135–145)

## 2019-09-06 LAB — TSH: TSH: 1.37 u[IU]/mL (ref 0.35–4.50)

## 2019-09-21 LAB — NOVEL CORONAVIRUS, NAA: SARS-CoV-2, NAA: NOT DETECTED

## 2019-11-06 ENCOUNTER — Encounter: Payer: Self-pay | Admitting: Internal Medicine

## 2019-11-29 ENCOUNTER — Ambulatory Visit: Payer: BC Managed Care – PPO | Attending: Internal Medicine

## 2019-11-29 DIAGNOSIS — Z23 Encounter for immunization: Secondary | ICD-10-CM | POA: Insufficient documentation

## 2019-11-29 LAB — HM COLONOSCOPY

## 2019-11-29 NOTE — Progress Notes (Signed)
   Covid-19 Vaccination Clinic  Name:  [name]    MRN: [id number] DOB: Feb 11, 1956  11/29/2019  Desiree Rosario was observed post Covid-19 immunization for 15 minutes without incident. She was provided with Vaccine Information Sheet and instruction to access the V-Safe system.   [name] was instructed to call 911 with any severe reactions post vaccine: Marland Kitchen Difficulty breathing  . Swelling of face and throat  . A fast heartbeat  . A bad rash all over body  . Dizziness and weakness

## 2019-12-02 ENCOUNTER — Encounter: Payer: Self-pay | Admitting: Internal Medicine

## 2019-12-31 ENCOUNTER — Ambulatory Visit: Payer: BC Managed Care – PPO | Attending: Internal Medicine

## 2019-12-31 DIAGNOSIS — Z23 Encounter for immunization: Secondary | ICD-10-CM

## 2019-12-31 NOTE — Progress Notes (Signed)
   Covid-19 Vaccination Clinic  Name:  ABBIEGAIL HAMPEL    MRN: [id number] DOB: 1955/11/26  12/31/2019  Ms. Hauschildt was observed post Covid-19 immunization for 15 minutes without incident. She was provided with Vaccine Information Sheet and instruction to access the V-Safe system.   Ms. Basich was instructed to call 911 with any severe reactions post vaccine: Marland Kitchen Difficulty breathing  . Swelling of face and throat  . A fast heartbeat  . A bad rash all over body  . Dizziness and weakness   Immunizations Administered    Name Date Dose VIS Date Route   Pfizer COVID-19 Vaccine 12/31/2019  1:00 PM 0.3 mL 09/06/2019 Intramuscular   Manufacturer: [place]   Lot: [id number]   [place]: [id number]

## 2020-03-17 ENCOUNTER — Encounter: Payer: BC Managed Care – PPO | Admitting: Internal Medicine

## 2020-06-28 IMAGING — DX DG CHEST 2V
2 series · 2 of 2 positions shown · non-contrast
Comparison: August 16, 2014

CLINICAL DATA: Chest pain

EXAM:
CHEST - 2 VIEW

[chest pa]
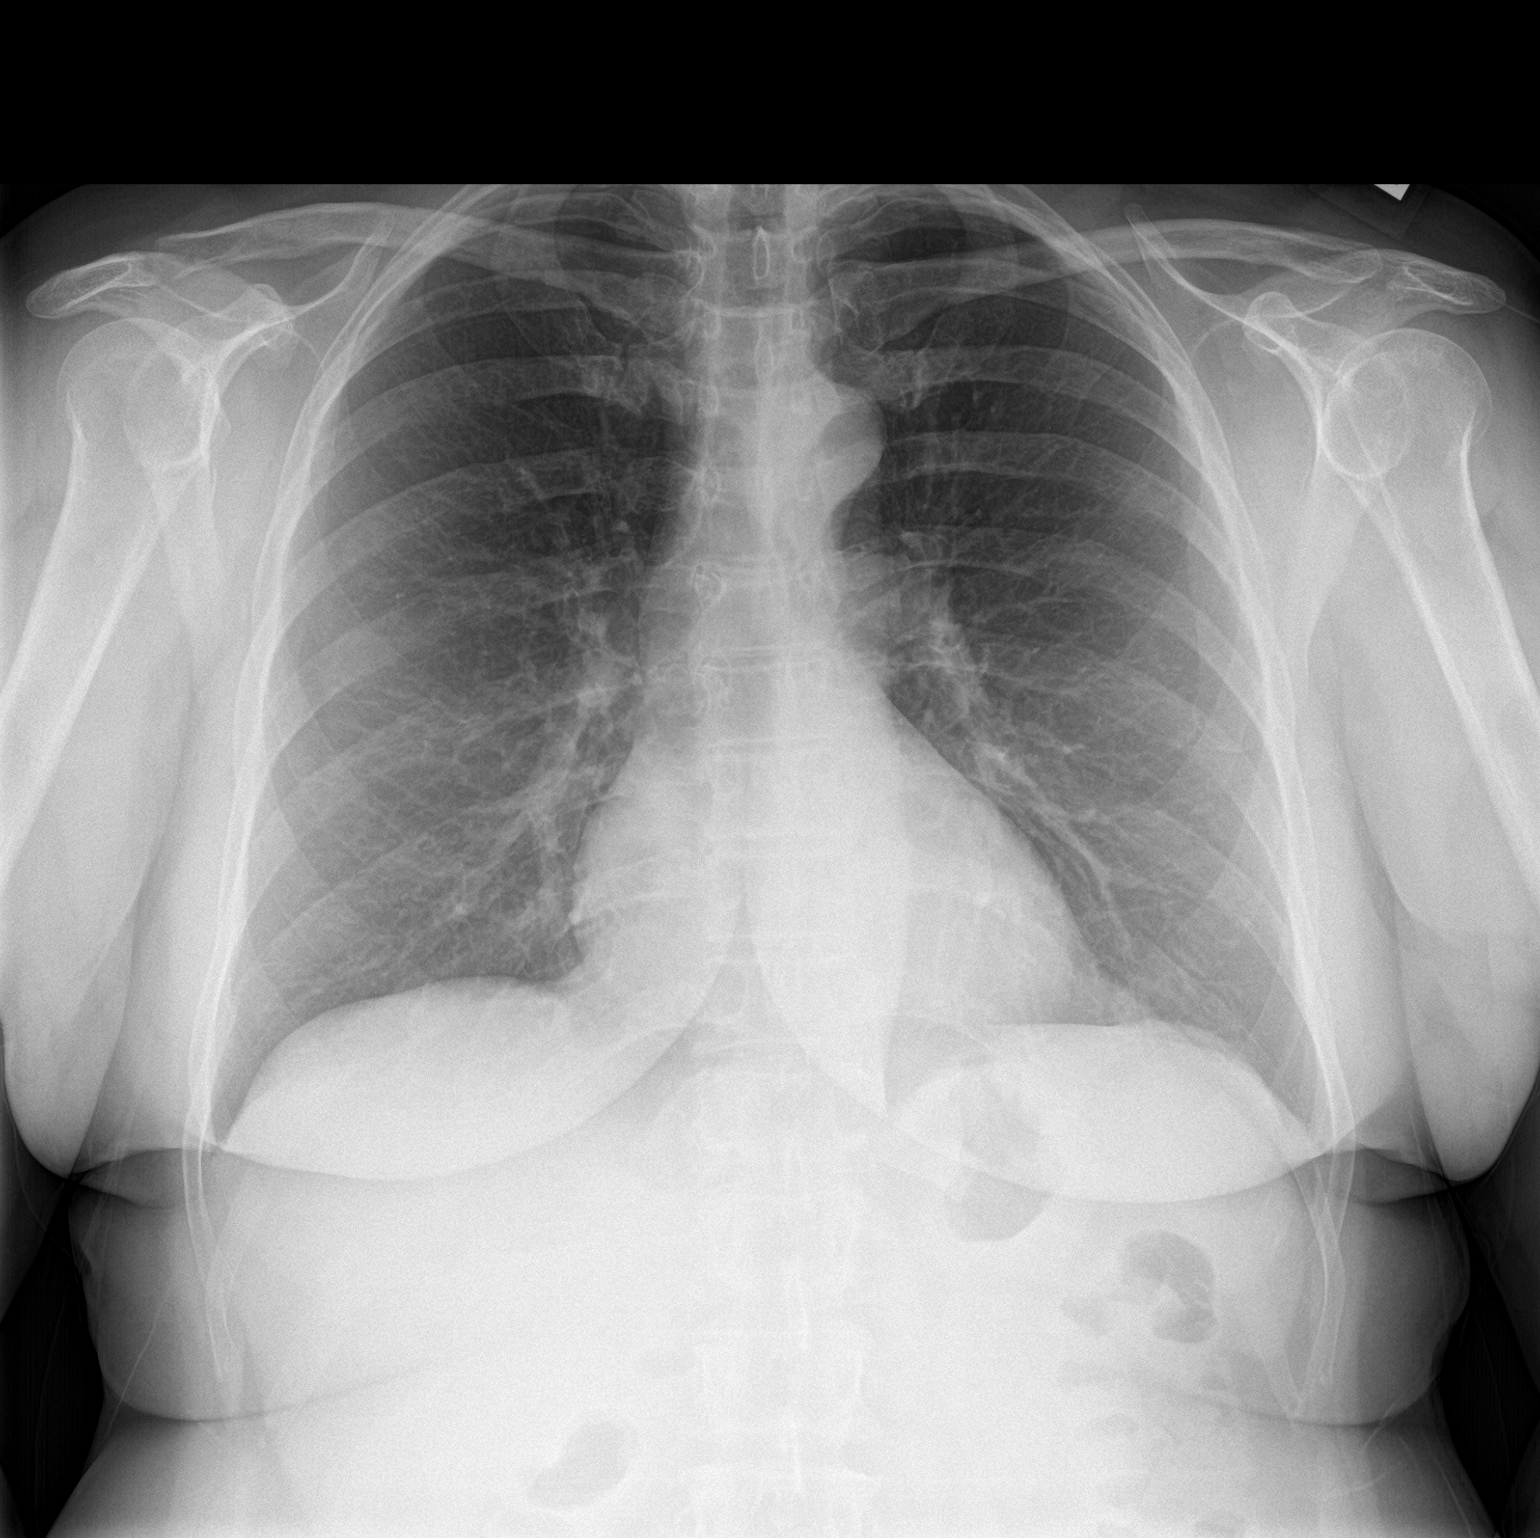

[chest lat]
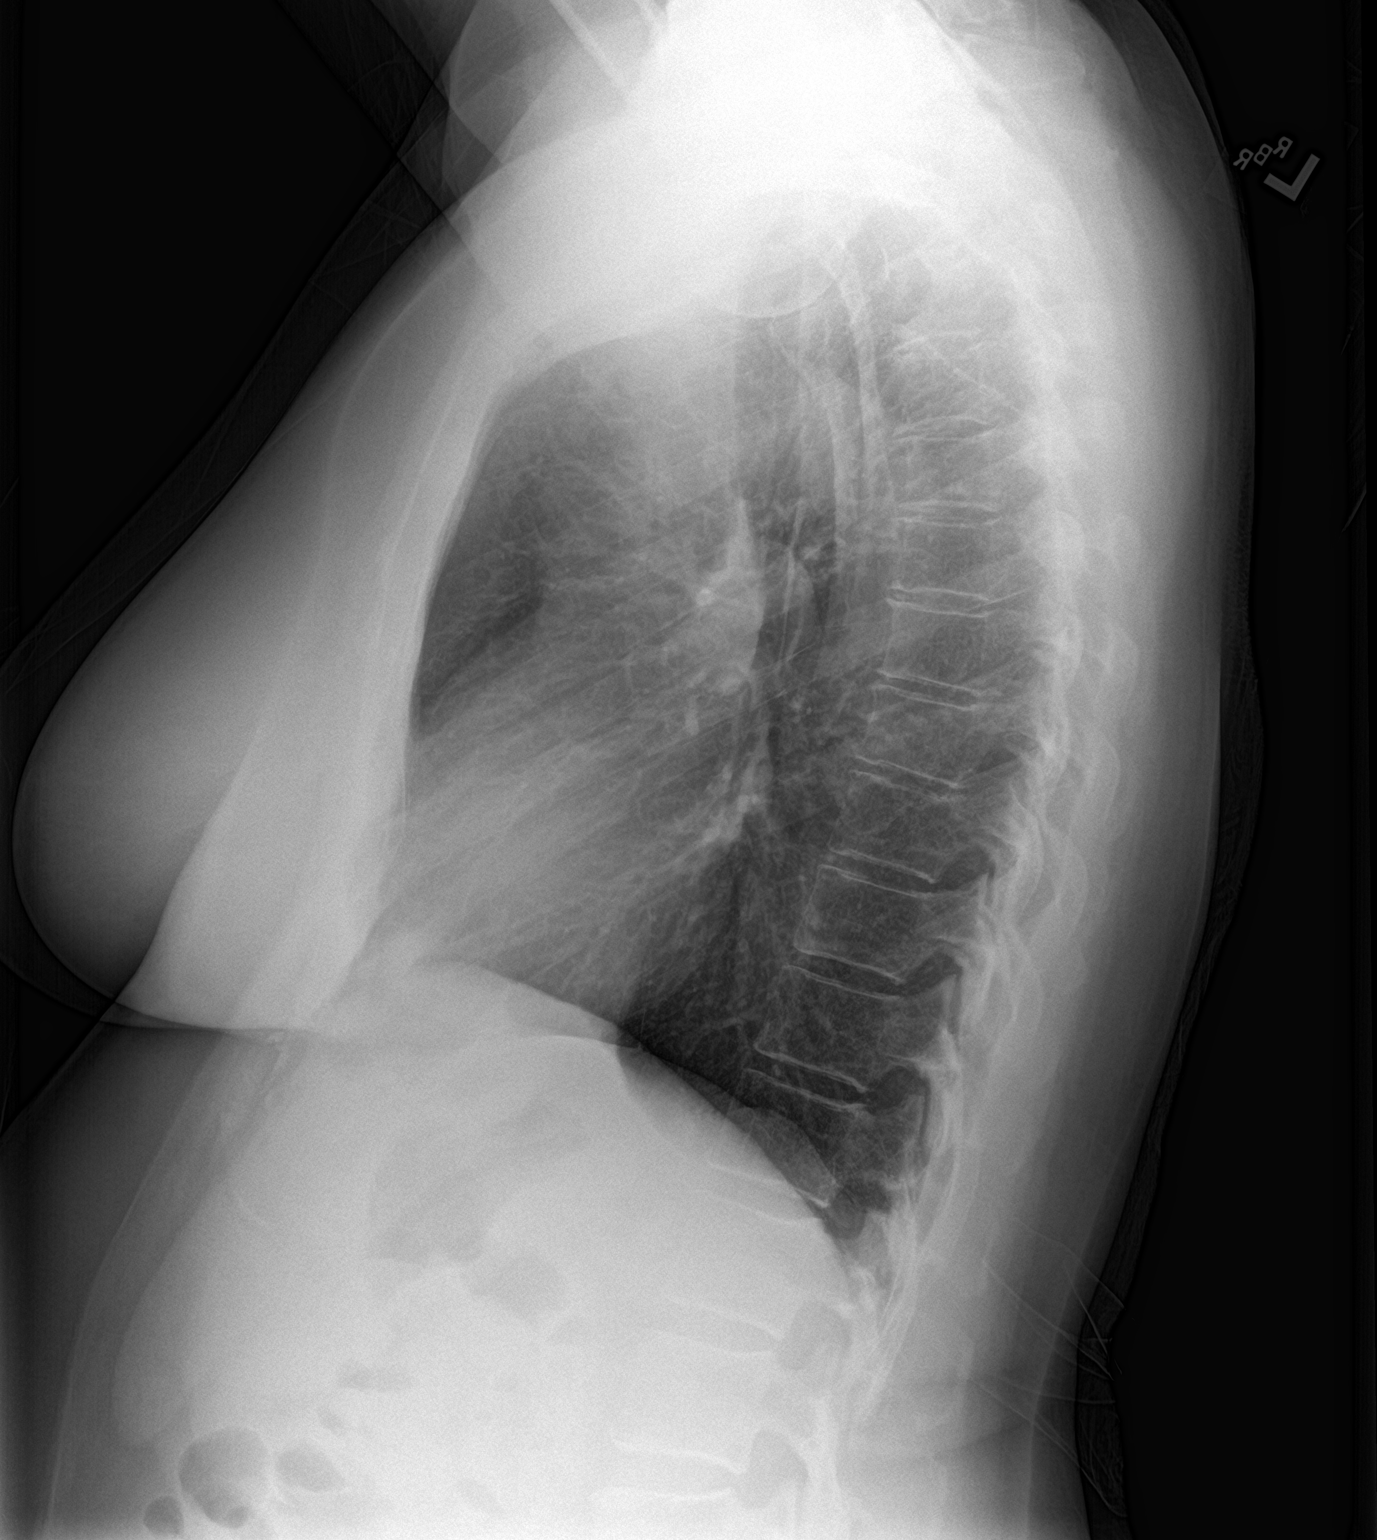

[2 of 2 positions shown; findings below may reference images not displayed]

FINDINGS: There is no edema or consolidation. The heart size and pulmonary
vascularity are normal. No adenopathy. No pneumothorax. No bone
lesions.
IMPRESSION: No edema or consolidation.

## 2020-09-08 ENCOUNTER — Ambulatory Visit (HOSPITAL_BASED_OUTPATIENT_CLINIC_OR_DEPARTMENT_OTHER)
Admission: RE | Admit: 2020-09-08 | Discharge: 2020-09-08 | Disposition: A | Discharge status: Home / Self Care | Payer: BC Managed Care – PPO | Source: Ambulatory Visit | Attending: Internal Medicine | Admitting: Internal Medicine

## 2020-09-08 ENCOUNTER — Encounter: Payer: Self-pay | Admitting: Internal Medicine

## 2020-09-08 ENCOUNTER — Other Ambulatory Visit: Payer: Self-pay

## 2020-09-08 ENCOUNTER — Ambulatory Visit: Payer: BC Managed Care – PPO

## 2020-09-08 ENCOUNTER — Ambulatory Visit (INDEPENDENT_AMBULATORY_CARE_PROVIDER_SITE_OTHER): Payer: BC Managed Care – PPO | Admitting: Internal Medicine

## 2020-09-08 VITALS — BP 124/84 | HR 80 | Temp 98.2°F | Resp 16 | Ht 65.0 in | Wt 176.4 lb

## 2020-09-08 DIAGNOSIS — R1012 Left upper quadrant pain: Secondary | ICD-10-CM

## 2020-09-08 DIAGNOSIS — R0789 Other chest pain: Secondary | ICD-10-CM | POA: Diagnosis not present

## 2020-09-08 NOTE — Progress Notes (Signed)
Pre visit review using our clinic review tool, if applicable. No additional management support is needed unless otherwise documented below in the visit note. 

## 2020-09-08 NOTE — Patient Instructions (Addendum)
Please schedule a physical exam at your earliest convenience.   Go to the first floor and obtain a chest x-ray.  They can also schedule an abdominal ultrasound  Okay to take Tylenol as needed for pain  If you get better in few days nothing else needs to be done.  If you are not better or you develop other symptoms such as fever, cough, rash, diarrhea:  call immediately.

## 2020-09-08 NOTE — Progress Notes (Signed)
Subjective:    Patient ID: Desiree Rosario, female    DOB: 1955/11/11, 63 y.o.   MRN: [id number]  DOS:  09/08/2020 Type of visit - description: acute, last visit 07-2019 Symptoms started approximately 3 or 4 days ago. Has a steady soreness located at the left upper quadrant abdomen/left anterior chest. The discomfort increased sharply with certain positions like bending and turning. She does not recall any injury. The pain does not radiate It  does increase with cough.   Review of Systems No fever chills No SOB or DOE.  No substernal chest pain. Denies nausea, vomiting, diarrhea.  No blood in the stools. No GERD symptoms, no dysphagia or odynophagia No rash. No recent airplane trips or prolonged car trips.  Past Medical History:  Diagnosis Date  . Allergy   . Cervical radiculopathy   . Cervical spondylolysis   . Chronic headaches   . GERD (gastroesophageal reflux disease)   . History of colon polyps   . Hyperlipidemia   . IBS (irritable bowel syndrome)   . Menopausal vasomotor syndrome 10/27/2017  . Obesity   . SCC (squamous cell carcinoma), face     Past Surgical History:  Procedure Laterality Date  . ABLATION    . BREAST LUMPECTOMY     fibroadenomas  . FACET JOINT INJECTION  2018   neck  . LEG SURGERY     RT- fracture  . ROTATOR CUFF REPAIR Right     Allergies as of 09/08/2020   No Known Allergies     Medication List       Accurate as of September 08, 2020 11:59 PM. If you have any questions, ask your nurse or doctor.        acetaminophen 650 MG CR tablet Commonly known as: TYLENOL Take by mouth.   azelastine 0.1 % nasal spray Commonly known as: ASTELIN Place 2 sprays into both nostrils at bedtime as needed for rhinitis. Use in each nostril as directed   cetirizine 10 MG tablet Commonly known as: ZYRTEC Take by mouth.   folic acid 1 MG tablet Commonly known as: FOLVITE Take 1 mg by mouth daily.   methotrexate 2.5 MG tablet Commonly known  as: RHEUMATREX TAKE 6 TABLETS EVERY WEEK ON THE SAME DAY   pantoprazole 40 MG tablet Commonly known as: PROTONIX Take 40 mg by mouth daily.   PARoxetine 10 MG tablet Commonly known as: PAXIL Take 1 tablet by mouth daily.   Restasis 0.05 % ophthalmic emulsion Generic drug: cycloSPORINE INSTILL 1 DROP(S) IN EACH EYE BY OPHTHALMIC ROUTE 2 TIME(S) PER DAY          Objective:   Physical Exam Abdominal:      BP 124/84 (BP Location: Left Arm, Patient Position: Sitting, Cuff Size: Small)   Pulse 80   Temp 98.2 F (36.8 C) (Oral)   Resp 16   Ht 5\' 5"  (1.651 m)   Wt 176 lb 6 oz (80 kg)   SpO2 99%   BMI 29.35 kg/m  General:   Well developed, NAD, BMI noted.  HEENT:  Normocephalic . Face symmetric, atraumatic Lungs:  CTA B Normal respiratory effort, no intercostal retractions, no accessory muscle use. Heart: RRR,  no murmur.  Abdomen:  Not distended, soft, see graphic Skin: Not pale. Not jaundice.  No rash Lower extremities: no pretibial edema bilaterally. Calves symmetric, soft and not tender Neurologic:  alert & oriented X3.  Speech normal, gait appropriate for age and unassisted Psych--  Cognition and judgment  appear intact.  Cooperative with normal attention span and concentration.  Behavior appropriate. No anxious or depressed appearing.     Assessment     ASSESSMENT new patient 07-2019, referred by husband Lavan Hyperlipidemia GERD, IBS-diarrhea predominant, Dermatology, Dr Melina Copa --Psoriasis (MTX) --Skin cancer, SCC MSK:  History of neck pain, has seen pain management before Menopausal: on paxil  PLAN L chest wall/LUQ abd pain: As described above, suspect MSK/chest wall pain; ddx also includes  pleurisy versus others. On clinical grounds very low suspicion for a serious etiology such as PE. Plan: Chest x-ray, abdominal ultrasound rule out organomegaly, if negative recommend to wait and see x few days, Tylenol as needed, if symptoms self resolve no  further work-up.  If symptoms increase, she developed  fever, nausea, rash, difficulty breathing: Then she will need to contact immediately Encouraged to return for a physical exam    This visit occurred during the SARS-CoV-2 public health emergency.  Safety protocols were in place, including screening questions prior to the visit, additional usage of staff PPE, and extensive cleaning of exam room while observing appropriate contact time as indicated for disinfecting solutions.

## 2020-09-09 ENCOUNTER — Ambulatory Visit (INDEPENDENT_AMBULATORY_CARE_PROVIDER_SITE_OTHER): Payer: BC Managed Care – PPO

## 2020-09-09 DIAGNOSIS — R1012 Left upper quadrant pain: Secondary | ICD-10-CM | POA: Diagnosis not present

## 2020-09-09 NOTE — Assessment & Plan Note (Signed)
L chest wall/LUQ abd pain: As described above, suspect MSK/chest wall pain; ddx also includes  pleurisy versus others. On clinical grounds very low suspicion for a serious etiology such as PE. Plan: Chest x-ray, abdominal ultrasound rule out organomegaly, if negative recommend to wait and see x few days, Tylenol as needed, if symptoms self resolve no further work-up.  If symptoms increase, she developed  fever, nausea, rash, difficulty breathing: Then she will need to contact immediately Encouraged to return for a physical exam

## 2020-09-30 ENCOUNTER — Other Ambulatory Visit: Payer: Self-pay

## 2020-09-30 ENCOUNTER — Telehealth (INDEPENDENT_AMBULATORY_CARE_PROVIDER_SITE_OTHER): Payer: BC Managed Care – PPO | Admitting: Internal Medicine

## 2020-09-30 ENCOUNTER — Encounter: Payer: Self-pay | Admitting: Internal Medicine

## 2020-09-30 VITALS — BP 116/98 | Temp 97.3°F | Ht 65.0 in | Wt 174.0 lb

## 2020-09-30 DIAGNOSIS — B349 Viral infection, unspecified: Secondary | ICD-10-CM | POA: Diagnosis not present

## 2020-09-30 MED ORDER — BENZONATATE 200 MG PO CAPS: 200.0000 mg | ORAL_CAPSULE | Freq: Three times a day (TID) | ORAL | 0 refills | Status: DC | PRN

## 2020-09-30 NOTE — Progress Notes (Unsigned)
Subjective:    Patient ID: Desiree Rosario, female    DOB: Sep 10, 1956, 65 y.o.   MRN: [id number]  DOS:  09/30/2020 Type of visit - description: Virtual Visit via Video Note  I connected with the above patient  by a video enabled telemedicine application and verified that I am speaking with the correct person using two identifiers.   THIS ENCOUNTER IS A VIRTUAL VISIT DUE TO COVID-19 - PATIENT WAS NOT SEEN IN THE OFFICE. PATIENT HAS CONSENTED TO VIRTUAL VISIT / TELEMEDICINE VISIT   Location of patient: home  Location of provider: office  Persons participating in the virtual visit: patient, provider   I discussed the limitations of evaluation and management by telemedicine and the availability of in person appointments. The patient expressed understanding and agreed to proceed.  Acute Symptoms started a day ago: Headache, sinus pressure, watery runny nose, chills. Subsequently developed dry cough that at times is persistent She is also having sore throat and some ear congestion.  Denies chest pain no difficulty breathing. No wheezing. No nausea or vomiting. + Malaise and sweats.  Review of Systems See above   Past Medical History:  Diagnosis Date  . Allergy   . Cervical radiculopathy   . Cervical spondylolysis   . Chronic headaches   . GERD (gastroesophageal reflux disease)   . History of colon polyps   . Hyperlipidemia   . IBS (irritable bowel syndrome)   . Menopausal vasomotor syndrome 10/27/2017  . Obesity   . SCC (squamous cell carcinoma), face     Past Surgical History:  Procedure Laterality Date  . ABLATION    . BREAST LUMPECTOMY     fibroadenomas  . FACET JOINT INJECTION  2018   neck  . LEG SURGERY     RT- fracture  . ROTATOR CUFF REPAIR Right     Allergies as of 09/30/2020   No Known Allergies     Medication List       Accurate as of September 30, 2020 11:59 PM. If you have any questions, ask your nurse or doctor.        acetaminophen 650 MG CR  tablet Commonly known as: TYLENOL Take by mouth.   azelastine 0.1 % nasal spray Commonly known as: ASTELIN Place 2 sprays into both nostrils at bedtime as needed for rhinitis. Use in each nostril as directed   benzonatate 200 MG capsule Commonly known as: TESSALON Take 1 capsule (200 mg total) by mouth 3 (three) times daily as needed for cough. Started by: Willow Ora, MD   cetirizine 10 MG tablet Commonly known as: ZYRTEC Take by mouth.   folic acid 1 MG tablet Commonly known as: FOLVITE Take 1 mg by mouth daily.   methotrexate 2.5 MG tablet Commonly known as: RHEUMATREX TAKE 6 TABLETS EVERY WEEK ON THE SAME DAY   pantoprazole 40 MG tablet Commonly known as: PROTONIX Take 40 mg by mouth daily.   PARoxetine 10 MG tablet Commonly known as: PAXIL Take 1 tablet by mouth daily.   Restasis 0.05 % ophthalmic emulsion Generic drug: cycloSPORINE INSTILL 1 DROP(S) IN EACH EYE BY OPHTHALMIC ROUTE 2 TIME(S) PER DAY          Objective:   Physical Exam BP (!) 116/98   Temp (!) 97.3 F (36.3 C)   Ht 5\' 5"  (1.651 m)   Wt 174 lb (78.9 kg)   BMI 28.96 kg/m  This is a virtual video visit, alert oriented x3, some cough noted, no respiratory  distress. Afebrile.     Assessment     ASSESSMENT new patient 07-2019, referred by husband Lavan Hyperlipidemia GERD, IBS-diarrhea predominant, Dermatology, Dr Melina Copa --Psoriasis (MTX) --Skin cancer, SCC MSK:  History of neck pain, has seen pain management before Menopausal: on paxil  PLAN Viral syndrome: Symptoms a started yesterday, she had 3 Covid vaccinations and her flu shot. Does not look toxic. DDx URI, Covid, influenza, other viruses. Less likely bacterial pneumonia. Plan: Get tested for Covid Rest, fluids, Tylenol, Advil, Mucinex DM, Flonase. Tessalon Perles sent. ER if symptoms severe. Note for work provided See AVS.   I discussed the assessment and treatment plan with the patient. The patient was provided an  opportunity to ask questions and all were answered. The patient agreed with the plan and demonstrated an understanding of the instructions.   The patient was advised to call back or seek an in-person evaluation if the symptoms worsen or if the condition fails to improve as anticipated.

## 2020-09-30 NOTE — Progress Notes (Signed)
Pre visit review using our clinic review tool, if applicable. No additional management support is needed unless otherwise documented below in the visit note. 

## 2020-10-01 ENCOUNTER — Telehealth: Payer: Self-pay | Admitting: Internal Medicine

## 2020-10-01 MED ORDER — GUAIFENESIN-CODEINE 100-10 MG/5ML PO SOLN
5.0000 mL | Freq: Three times a day (TID) | ORAL | 0 refills | Status: DC | PRN
Start: 2020-10-01 — End: 2020-10-19

## 2020-10-01 NOTE — Telephone Encounter (Signed)
Spoke w/ Pt- informed of recommendations. Message sent to [facility].

## 2020-10-01 NOTE — Assessment & Plan Note (Signed)
Viral syndrome: Symptoms a started yesterday, she had 3 Covid vaccinations and her flu shot. Does not look toxic. DDx URI, Covid, influenza, other viruses. Less likely bacterial pneumonia. Plan: Get tested for Covid Rest, fluids, Tylenol, Advil, Mucinex DM, Flonase. Tessalon Perles sent. ER if symptoms severe. Note for work provided See AVS.

## 2020-10-01 NOTE — Telephone Encounter (Signed)
In addition to what we're already doing, I'll send a prescription for codeine for cough. Watch for drowsiness. Advised that she could be potentially a candidate for antivirals or antibodies but resources are very limited in the community, if she is not feeling somewhat better in the next couple of days to let us know.

## 2020-10-01 NOTE — Telephone Encounter (Signed)
FYI. Please advise.

## 2020-10-01 NOTE — Telephone Encounter (Signed)
[facility]  Call Back # [phone]  Patient called to advise DR Drue Novel that she took  at home test, patient states she is postive for Covid 19. Patient states she is still having bad cough and fever. Patient would like another medication sent to the pharmacy.

## 2020-10-19 ENCOUNTER — Telehealth (INDEPENDENT_AMBULATORY_CARE_PROVIDER_SITE_OTHER): Payer: BC Managed Care – PPO | Admitting: Internal Medicine

## 2020-10-19 ENCOUNTER — Other Ambulatory Visit: Payer: Self-pay

## 2020-10-19 ENCOUNTER — Ambulatory Visit (HOSPITAL_BASED_OUTPATIENT_CLINIC_OR_DEPARTMENT_OTHER)
Admission: RE | Admit: 2020-10-19 | Discharge: 2020-10-19 | Disposition: A | Discharge status: Home / Self Care | Payer: BC Managed Care – PPO | Source: Ambulatory Visit | Attending: Internal Medicine | Admitting: Internal Medicine

## 2020-10-19 ENCOUNTER — Encounter: Payer: BC Managed Care – PPO | Admitting: Internal Medicine

## 2020-10-19 ENCOUNTER — Encounter: Payer: Self-pay | Admitting: Internal Medicine

## 2020-10-19 VITALS — Ht 65.0 in

## 2020-10-19 DIAGNOSIS — U071 COVID-19: Secondary | ICD-10-CM | POA: Diagnosis present

## 2020-10-19 DIAGNOSIS — R059 Cough, unspecified: Secondary | ICD-10-CM

## 2020-10-19 MED ORDER — AZITHROMYCIN 250 MG PO TABS: ORAL_TABLET | ORAL | 0 refills | Status: DC

## 2020-10-19 MED ORDER — GUAIFENESIN-CODEINE 100-10 MG/5ML PO SOLN: 5.0000 mL | Freq: Three times a day (TID) | ORAL | 0 refills | Status: DC | PRN

## 2020-10-19 NOTE — Progress Notes (Signed)
Subjective:    Patient ID: Desiree Rosario, female    DOB: 1955-12-25, 65 y.o.   MRN: [id number]  DOS:  10/19/2020 Type of visit - description: Virtual Visit via Video Note  I connected with the above patient  by a video enabled telemedicine application and verified that I am speaking with the correct person using two identifiers.   THIS ENCOUNTER IS A VIRTUAL VISIT DUE TO COVID-19 - PATIENT WAS NOT SEEN IN THE OFFICE. PATIENT HAS CONSENTED TO VIRTUAL VISIT / TELEMEDICINE VISIT   Location of patient: home  Location of provider: office  Persons participating in the virtual visit: patient, provider   I discussed the limitations of evaluation and management by telemedicine and the availability of in person appointments. The patient expressed understanding and agreed to proceed.  Acute The patient developed respiratory symptoms 09/30/2019, subsequently tested positive for Covid, see last visit. Since then, she is improving but continues with cough.  Denies fever chills Still has some fatigue some mild aches and pains. Denies chest pain or difficulty breathing but she does have some DOE. Has some headaches most mornings, respond to Tylenol.  Review of Systems See above   Past Medical History:  Diagnosis Date  . Allergy   . Cervical radiculopathy   . Cervical spondylolysis   . Chronic headaches   . GERD (gastroesophageal reflux disease)   . History of colon polyps   . Hyperlipidemia   . IBS (irritable bowel syndrome)   . Menopausal vasomotor syndrome 10/27/2017  . Obesity   . SCC (squamous cell carcinoma), face     Past Surgical History:  Procedure Laterality Date  . ABLATION    . BREAST LUMPECTOMY     fibroadenomas  . FACET JOINT INJECTION  2018   neck  . LEG SURGERY     RT- fracture  . ROTATOR CUFF REPAIR Right     Allergies as of 10/19/2020   No Known Allergies     Medication List       Accurate as of October 19, 2020 11:59 PM. If you have any questions, ask  your nurse or doctor.        acetaminophen 650 MG CR tablet Commonly known as: TYLENOL Take by mouth.   azelastine 0.1 % nasal spray Commonly known as: ASTELIN Place 2 sprays into both nostrils at bedtime as needed for rhinitis. Use in each nostril as directed   azithromycin 250 MG tablet Commonly known as: ZITHROMAX Take 2 tablets by mouth first day, then 1 tablet for 4 additional days Started by: Kathlene November, MD   benzonatate 200 MG capsule Commonly known as: TESSALON Take 1 capsule (200 mg total) by mouth 3 (three) times daily as needed for cough.   cetirizine 10 MG tablet Commonly known as: ZYRTEC Take by mouth.   folic acid 1 MG tablet Commonly known as: FOLVITE Take 1 mg by mouth daily.   guaiFENesin-codeine 100-10 MG/5ML syrup Take 5 mLs by mouth 3 (three) times daily as needed for cough.   methotrexate 2.5 MG tablet Commonly known as: RHEUMATREX TAKE 6 TABLETS EVERY WEEK ON THE SAME DAY   pantoprazole 40 MG tablet Commonly known as: PROTONIX Take 40 mg by mouth daily.   PARoxetine 10 MG tablet Commonly known as: PAXIL Take 1 tablet by mouth daily.   Restasis 0.05 % ophthalmic emulsion Generic drug: cycloSPORINE INSTILL 1 DROP(S) IN EACH EYE BY OPHTHALMIC ROUTE 2 TIME(S) PER DAY  Objective:   Physical Exam Ht 5\' 5"  (1.651 m)   BMI 28.96 kg/m  This is a virtual video visit, she is alert oriented x3, in no distress, nontoxic appearing.  She did cough a couple of times during the visit, I noticed some large airway congestion.     Assessment      ASSESSMENT new patient 07-2019, referred by husband Lavan Hyperlipidemia GERD, IBS-diarrhea predominant, Dermatology, Dr Melina Copa --Psoriasis (MTX) --Skin cancer, SCC MSK:  History of neck pain, has seen pain management before Menopausal: on paxil  PLAN COVID-19, persistent cough: sxs started 09/29/2020, subsequently she tested positive for Covid, has been treated conservatively, although is  better she has a persistent cough, some decreased energy and some DOE. Large airway congestion noted during the visit. Plan: Continue tussin  DM, Tessalon Perles, RF codeine liquid to be used as needed for cough. She possibly has bronchitis, to be sure we will get a CXR and start Zithromax. Call if not better. Follow-up already scheduled for next month. .   I discussed the assessment and treatment plan with the patient. The patient was provided an opportunity to ask questions and all were answered. The patient agreed with the plan and demonstrated an understanding of the instructions.   The patient was advised to call back or seek an in-person evaluation if the symptoms worsen or if the condition fails to improve as anticipated.

## 2020-10-19 NOTE — Progress Notes (Unsigned)
Pre visit review using our clinic review tool, if applicable. No additional management support is needed unless otherwise documented below in the visit note. 

## 2020-10-20 NOTE — Assessment & Plan Note (Signed)
COVID-19, persistent cough: sxs started 09/29/2020, subsequently she tested positive for Covid, has been treated conservatively, although is better she has a persistent cough, some decreased energy and some DOE. Large airway congestion noted during the visit. Plan: Continue tussin  DM, Tessalon Perles, RF codeine liquid to be used as needed for cough. She possibly has bronchitis, to be sure we will get a CXR and start Zithromax. Call if not better. Follow-up already scheduled for next month. Marland Kitchen

## 2020-11-16 ENCOUNTER — Encounter: Payer: Self-pay | Admitting: Internal Medicine

## 2020-11-16 ENCOUNTER — Ambulatory Visit (INDEPENDENT_AMBULATORY_CARE_PROVIDER_SITE_OTHER): Payer: BC Managed Care – PPO | Admitting: Internal Medicine

## 2020-11-16 ENCOUNTER — Other Ambulatory Visit: Payer: Self-pay

## 2020-11-16 VITALS — BP 118/72 | HR 59 | Temp 97.9°F | Ht 65.0 in | Wt 178.8 lb

## 2020-11-16 DIAGNOSIS — N951 Menopausal and female climacteric states: Secondary | ICD-10-CM | POA: Diagnosis not present

## 2020-11-16 DIAGNOSIS — Z0000 Encounter for general adult medical examination without abnormal findings: Secondary | ICD-10-CM | POA: Diagnosis not present

## 2020-11-16 NOTE — Progress Notes (Signed)
Subjective:    Patient ID: Desiree Rosario, female    DOB: [birth year], 65 y.o.   MRN: [id number]  DOS:  11/16/2020 Type of visit - description: cpx Diagnosed with COVID last month, she is now feeling essentially back to normal.  Has no concerns.   Review of Systems   A 14 point review of systems is negative    Past Medical History:  Diagnosis Date  . Allergy   . Cervical radiculopathy   . Cervical spondylolysis   . Chronic headaches   . GERD (gastroesophageal reflux disease)   . History of colon polyps   . Hyperlipidemia   . IBS (irritable bowel syndrome)   . Menopausal vasomotor syndrome 10/27/2017  . Obesity   . SCC (squamous cell carcinoma), face     Past Surgical History:  Procedure Laterality Date  . ABLATION    . BREAST LUMPECTOMY     fibroadenomas  . FACET JOINT INJECTION  2018   neck  . LEG SURGERY     RT- fracture  . ROTATOR CUFF REPAIR Right     Allergies as of 11/16/2020   No Known Allergies     Medication List       Accurate as of November 16, 2020 11:59 PM. If you have any questions, ask your nurse or doctor.        STOP taking these medications   acetaminophen 650 MG CR tablet Commonly known as: TYLENOL Stopped by: Kathlene November, MD   azithromycin 250 MG tablet Commonly known as: ZITHROMAX Stopped by: Kathlene November, MD   benzonatate 200 MG capsule Commonly known as: TESSALON Stopped by: Kathlene November, MD   guaiFENesin-codeine 100-10 MG/5ML syrup Stopped by: Kathlene November, MD   PARoxetine 10 MG tablet Commonly known as: PAXIL Stopped by: Kathlene November, MD   Restasis 0.05 % ophthalmic emulsion Generic drug: cycloSPORINE Stopped by: Kathlene November, MD     TAKE these medications   azelastine 0.1 % nasal spray Commonly known as: ASTELIN Place 2 sprays into both nostrils at bedtime as needed for rhinitis. Use in each nostril as directed   cetirizine 10 MG tablet Commonly known as: ZYRTEC Take by mouth.   folic acid 1 MG tablet Commonly known as:  FOLVITE Take 1 mg by mouth daily.   methotrexate 2.5 MG tablet Commonly known as: RHEUMATREX TAKE 6 TABLETS EVERY WEEK ON THE SAME DAY   pantoprazole 40 MG tablet Commonly known as: PROTONIX Take 40 mg by mouth daily.          Objective:   Physical Exam BP 118/72 (BP Location: Right Arm, Patient Position: Sitting, Cuff Size: Large)   Pulse (!) 59   Temp 97.9 F (36.6 C) (Oral)   Ht 5\' 5"  (1.651 m)   Wt 178 lb 12.8 oz (81.1 kg)   SpO2 98%   BMI 29.75 kg/m  General: Well developed, NAD, BMI noted Neck: No  thyromegaly  HEENT:  Normocephalic . Face symmetric, atraumatic Lungs:  CTA B Normal respiratory effort, no intercostal retractions, no accessory muscle use. Heart: RRR,  no murmur.  Abdomen:  Not distended, soft, non-tender. No rebound or rigidity.   Lower extremities: no pretibial edema bilaterally  Skin: Exposed areas without rash. Not pale. Not jaundice Neurologic:  alert & oriented X3.  Speech normal, gait appropriate for age and unassisted Strength symmetric and appropriate for age.  Psych: Cognition and judgment appear intact.  Cooperative with normal attention span and concentration.  Behavior appropriate. No anxious  or depressed appearing.     Assessment     ASSESSMENT new patient 07-2019, referred by husband Lavan Hyperlipidemia GERD, IBS-diarrhea predominant, Dermatology, Dr Melina Copa --Psoriasis (MTX) --Skin cancer, SCC MSK:  History of neck pain, has seen pain management before Menopausal: used to take paxil OSA: dx via [facility] 05/2020 , Dr Maxwell Caul, on a Cpap   PLAN Here for CPX Hyperlipidemia: Diet controlled, checking labs GERD: Controlled on Protonix Psoriasis: Not getting better on MTX, her dermatologist planning to prescribe a biological, the patient aware of the option to take a long-term monoclonal antibody against COVID.   OSA: New dx since last visit, uses a CPAP RTC 1 year     This visit occurred during the SARS-CoV-2 public  health emergency.  Safety protocols were in place, including screening questions prior to the visit, additional usage of staff PPE, and extensive cleaning of exam room while observing appropriate contact time as indicated for disinfecting solutions.

## 2020-11-16 NOTE — Patient Instructions (Addendum)
Start taking vitamin D 1000 units a day. Schedule your mammogram at your earliest convenience Stop by the first floor and schedule a bone density test  GO TO THE FRONT DESK, PLEASE SCHEDULE YOUR APPOINTMENTS Come back for blood work, fasting within the next few days Come back for a physical exam in 1 year      Advance Directive  Advance directives are legal documents that allow you to make decisions about your health care and medical treatment in case you become unable to communicate for yourself. Advance directives let your wishes be known to family, friends, and health care providers. Discussing and writing advance directives should happen over time rather than all at once. Advance directives can be changed and updated at any time. There are different types of advance directives, such as:  Medical power of attorney.  Living will.  Do not resuscitate (DNR) order or do not attempt resuscitation (DNAR) order. Health care proxy and medical power of attorney A health care proxy is also called a health care agent. This person is appointed to make medical decisions for you when you are unable to make decisions for yourself. Generally, people ask a trusted friend or family member to act as their proxy and represent their preferences. Make sure you have an agreement with your trusted person to act as your proxy. A proxy may have to make a medical decision on your behalf if your wishes are not known. A medical power of attorney, also called a durable power of attorney for health care, is a legal document that names your health care proxy. Depending on the laws in your state, the document may need to be:  Signed.  Notarized.  Dated.  Copied.  Witnessed.  Incorporated into your medical record. You may also want to appoint a trusted person to manage your money in the event you are unable to do so. This is called a durable power of attorney for finances. It is a separate legal document from  the durable power of attorney for health care. You may choose your health care proxy or someone different to act as your agent in money matters. If you do not appoint a proxy, or there is a concern that the proxy is not acting in your best interest, a court may appoint a guardian to act on your behalf. Living will A living will is a set of instructions that state your wishes about medical care when you cannot express them yourself. Health care providers should keep a copy of your living will in your medical record. You may want to give a copy to family members or friends. To alert caregivers in case of an emergency, you can place a card in your wallet to let them know that you have a living will and where they can find it. A living will is used if you become:  Terminally ill.  Disabled.  Unable to communicate or make decisions. The following decisions should be included in your living will:  To use or not to use life support equipment, such as dialysis machines and breathing machines (ventilators).  Whether you want a DNR or DNAR order. This tells health care providers not to use cardiopulmonary resuscitation (CPR) if breathing or heartbeat stops.  To use or not to use tube feeding.  To be given or not to be given food and fluids.  Whether you want comfort (palliative) care when the goal becomes comfort rather than a cure.  Whether you want to donate your  organs and tissues. A living will does not give instructions for distributing your money and property if you should pass away. DNR or DNAR A DNR or DNAR order is a request not to have CPR in the event that your heart stops beating or you stop breathing. If a DNR or DNAR order has not been made and shared, a health care provider will try to help any patient whose heart has stopped or who has stopped breathing. If you plan to have surgery, talk with your health care provider about how your DNR or DNAR order will be followed if problems  occur. What if I do not have an advance directive? Some states assign family decision makers to act on your behalf if you do not have an advance directive. Each state has its own laws about advance directives. You may want to check with your health care provider, attorney, or state representative about the laws in your state. Summary  Advance directives are legal documents that allow you to make decisions about your health care and medical treatment in case you become unable to communicate for yourself.  The process of discussing and writing advance directives should happen over time. You can change and update advance directives at any time.  Advance directives may include a medical power of attorney, a living will, and a DNR or DNAR order. This information is not intended to replace advice given to you by your health care provider. Make sure you discuss any questions you have with your health care provider. Document Revised: 06/16/2020 Document Reviewed: 06/16/2020 Elsevier Patient Education  2021 [employer].

## 2020-11-17 ENCOUNTER — Encounter: Payer: Self-pay | Admitting: Internal Medicine

## 2020-11-17 ENCOUNTER — Telehealth: Payer: Self-pay | Admitting: *Deleted

## 2020-11-17 NOTE — Assessment & Plan Note (Signed)
-  Td 2019 -completed Shingrix -COVID VAX x3 - Had a flu shot -CCS: EGD C-scope 2016: 1 polyp, C-scope 11/29/2019, normal, 7 years.    - See gynecology regularly.  MMG & PAP 08/20/2019 ([facility]).   Due for a MMG, plans to do @ the [place]: Recommend calcium, vitamin D, routine walking, check a DEXA -Diet exercise discussed --Labs : CBC, CMP, hep C, FLP, TSH, vitamin D

## 2020-11-17 NOTE — Telephone Encounter (Signed)
Pt has lab appt tomorrow. There are future order that are expected 4/1 and 5/21.  What labs are due tomorrow? Do we need to reschedule her lab appt.

## 2020-11-17 NOTE — Telephone Encounter (Signed)
All of them are due tomorrow please, she came in late for her apt and was not fasting and decided to come back. For those dates I had just to put an estimate since I edit them as future all together . -JMA

## 2020-11-17 NOTE — Assessment & Plan Note (Signed)
Here for CPX Hyperlipidemia: Diet controlled, checking labs GERD: Controlled on Protonix Psoriasis: Not getting better on MTX, her dermatologist planning to prescribe a biological, the patient aware of the option to take a long-term monoclonal antibody against COVID.   OSA: New dx since last visit, uses a CPAP RTC 1 year

## 2020-11-18 ENCOUNTER — Other Ambulatory Visit: Payer: Self-pay

## 2020-11-18 ENCOUNTER — Other Ambulatory Visit (INDEPENDENT_AMBULATORY_CARE_PROVIDER_SITE_OTHER): Payer: BC Managed Care – PPO

## 2020-11-18 DIAGNOSIS — Z0000 Encounter for general adult medical examination without abnormal findings: Secondary | ICD-10-CM | POA: Diagnosis not present

## 2020-11-18 LAB — CBC WITH DIFFERENTIAL/PLATELET
Basophils Absolute: 0 10*3/uL (ref 0.0–0.1)
Basophils Relative: 0.8 % (ref 0.0–3.0)
Eosinophils Absolute: 0.1 10*3/uL (ref 0.0–0.7)
Eosinophils Relative: 1 % (ref 0.0–5.0)
HCT: 36.8 % (ref 36.0–46.0)
Hemoglobin: 12.1 g/dL (ref 12.0–15.0)
Lymphocytes Relative: 22.5 % (ref 12.0–46.0)
Lymphs Abs: 1.3 10*3/uL (ref 0.7–4.0)
MCHC: 33 g/dL (ref 30.0–36.0)
MCV: 92.7 fl (ref 78.0–100.0)
Monocytes Absolute: 0.4 10*3/uL (ref 0.1–1.0)
Monocytes Relative: 7.2 % (ref 3.0–12.0)
Neutro Abs: 3.8 10*3/uL (ref 1.4–7.7)
Neutrophils Relative %: 68.5 % (ref 43.0–77.0)
Platelets: 250 10*3/uL (ref 150.0–400.0)
RBC: 3.97 Mil/uL (ref 3.87–5.11)
RDW: 14.6 % (ref 11.5–15.5)
WBC: 5.6 10*3/uL (ref 4.0–10.5)

## 2020-11-18 LAB — COMPREHENSIVE METABOLIC PANEL
ALT: 29 U/L (ref 0–35)
AST: 27 U/L (ref 0–37)
Albumin: 4.1 g/dL (ref 3.5–5.2)
Alkaline Phosphatase: 122 U/L — ABNORMAL HIGH (ref 39–117)
BUN: 13 mg/dL (ref 6–23)
CO2: 30 mEq/L (ref 19–32)
Calcium: 9.4 mg/dL (ref 8.4–10.5)
Chloride: 103 mEq/L (ref 96–112)
Creatinine, Ser: 0.89 mg/dL (ref 0.40–1.20)
GFR: 68.42 mL/min (ref >60.00)
Glucose, Bld: 103 mg/dL — ABNORMAL HIGH (ref 70–99)
Potassium: 4.5 mEq/L (ref 3.5–5.1)
Sodium: 139 mEq/L (ref 135–145)
Total Bilirubin: 0.6 mg/dL (ref 0.2–1.2)
Total Protein: 6.6 g/dL (ref 6.0–8.3)

## 2020-11-18 LAB — LIPID PANEL
Cholesterol: 220 mg/dL — ABNORMAL HIGH (ref 0–200)
HDL: 58.5 mg/dL (ref >39.00)
LDL Cholesterol: 144 mg/dL — ABNORMAL HIGH (ref 0–99)
NonHDL: 161.16
Total CHOL/HDL Ratio: 4
Triglycerides: 85 mg/dL (ref 0.0–149.0)
VLDL: 17 mg/dL (ref 0.0–40.0)

## 2020-11-18 LAB — VITAMIN D 25 HYDROXY (VIT D DEFICIENCY, FRACTURES): VITD: 29.73 ng/mL — ABNORMAL LOW (ref 30.00–100.00)

## 2020-11-18 LAB — TSH: TSH: 1.4 u[IU]/mL (ref 0.35–4.50)

## 2020-11-18 NOTE — Telephone Encounter (Signed)
Thank you :)

## 2020-11-19 LAB — HEPATITIS C ANTIBODY
Hepatitis C Ab: NONREACTIVE
SIGNAL TO CUT-OFF: 0.01 (ref <1.00)

## 2020-11-23 ENCOUNTER — Ambulatory Visit (HOSPITAL_BASED_OUTPATIENT_CLINIC_OR_DEPARTMENT_OTHER)
Admission: RE | Admit: 2020-11-23 | Discharge: 2020-11-23 | Disposition: A | Discharge status: Home / Self Care | Payer: BC Managed Care – PPO | Source: Ambulatory Visit | Attending: Internal Medicine | Admitting: Internal Medicine

## 2020-11-23 ENCOUNTER — Other Ambulatory Visit: Payer: Self-pay

## 2020-11-23 DIAGNOSIS — N951 Menopausal and female climacteric states: Secondary | ICD-10-CM | POA: Diagnosis not present

## 2020-11-23 MED ORDER — VITAMIN D (ERGOCALCIFEROL) 1.25 MG (50000 UNIT) PO CAPS: 50000.0000 [IU] | ORAL_CAPSULE | ORAL | 0 refills | Status: DC

## 2020-11-23 NOTE — Addendum Note (Signed)
Addended byDamita Dunnings D on: 11/23/2020 03:56 PM   Modules accepted: Orders

## 2020-12-22 ENCOUNTER — Encounter: Payer: Self-pay | Admitting: Internal Medicine

## 2021-05-03 LAB — HM MAMMOGRAPHY

## 2021-07-05 ENCOUNTER — Encounter: Payer: Self-pay | Admitting: Family Medicine

## 2021-07-05 ENCOUNTER — Other Ambulatory Visit: Payer: Self-pay

## 2021-07-05 ENCOUNTER — Ambulatory Visit (INDEPENDENT_AMBULATORY_CARE_PROVIDER_SITE_OTHER): Payer: BC Managed Care – PPO | Admitting: Family Medicine

## 2021-07-05 VITALS — BP 124/80 | HR 60 | Temp 98.1°F | Resp 18 | Ht 65.0 in | Wt 173.6 lb

## 2021-07-05 DIAGNOSIS — M79662 Pain in left lower leg: Secondary | ICD-10-CM | POA: Diagnosis not present

## 2021-07-05 MED ORDER — SKYRIZI 150 MG/ML ~~LOC~~ SOSY: 1.0000 mL | PREFILLED_SYRINGE | SUBCUTANEOUS | Status: AC

## 2021-07-05 NOTE — Patient Instructions (Signed)
Go downstairs after your blood work to schedule your ultrasound

## 2021-07-05 NOTE — Progress Notes (Signed)
Subjective:   By signing my name below, I, Zite Okoli, attest that this documentation has been prepared under the direction and in the presence of Ann Held, DO. 07/05/2021     Patient ID: Desiree Rosario, female    DOB: 24-Mar-1956, 65 y.o.   MRN: [id number]  Chief Complaint  Patient presents with   Calf pain    X2 months, left calf, pt states no injury, no swelling or redness. Pt states pain with walking    HPI Patient is in today for an office visit  She reports she has been experiencing pain and cramps in her left calf for the past 2 months. She mentions that sometimes when sitting or walking, it feels like it is burning. The pain is waxing and waning and the calf cramps at random times through out the day. She has not noticed any swelling, tenderness or any activity that makes the pain worse. She mentions the pain has been getting worse since it started but it does not keep her up at night. She tries to manage the cramps by massaging the calf and flexing her foot.  Past Medical History:  Diagnosis Date   Allergy    Cervical radiculopathy    Cervical spondylolysis    Chronic headaches    GERD (gastroesophageal reflux disease)    History of colon polyps    Hyperlipidemia    IBS (irritable bowel syndrome)    Menopausal vasomotor syndrome 10/27/2017   Obesity    SCC (squamous cell carcinoma), face     Past Surgical History:  Procedure Laterality Date   ABLATION     BREAST LUMPECTOMY     fibroadenomas   FACET JOINT INJECTION  2018   neck   LEG SURGERY     RT- fracture   ROTATOR CUFF REPAIR Right     Family History  Problem Relation Age of Onset   Cancer Mother        melanoma   Diabetes Father    Stroke Maternal Grandmother    Colon cancer Neg Hx    Breast cancer Neg Hx    CAD Neg Hx     Social History   Socioeconomic History   Marital status: Married    Spouse name: Not on file   Number of children: 2   Years of education: Not on file    Highest education level: Not on file  Occupational History   Occupation: works @ [employer]  Tobacco Use   Smoking status: Never   Smokeless tobacco: Never  Scientific laboratory technician Use: Never used  Substance and Sexual Activity   Alcohol use: No   Drug use: No   Sexual activity: Not Currently  Other Topics Concern   Not on file  Social History Narrative   Household: Pt and husband   Social Determinants of Radio broadcast assistant Strain: Not on file  Food Insecurity: Not on file  Transportation Needs: Not on file  Physical Activity: Not on file  Stress: Not on file  Social Connections: Not on file  Intimate Partner Violence: Not on file    Outpatient Medications Prior to Visit  Medication Sig Dispense Refill   azelastine (ASTELIN) 0.1 % nasal spray Place 2 sprays into both nostrils at bedtime as needed for rhinitis. Use in each nostril as directed 30 mL 6   folic acid (FOLVITE) 1 MG tablet Take 1 mg by mouth daily.     pantoprazole (PROTONIX) 40 MG  tablet Take 40 mg by mouth daily.     Vitamin D, Ergocalciferol, (DRISDOL) 1.25 MG (50000 UNIT) CAPS capsule Take 1 capsule (50,000 Units total) by mouth every 7 (seven) days. 12 capsule 0   methotrexate (RHEUMATREX) 2.5 MG tablet TAKE 6 TABLETS EVERY WEEK ON THE SAME DAY     cetirizine (ZYRTEC) 10 MG tablet Take by mouth. (Patient not taking: No sig reported)     No facility-administered medications prior to visit.    No Known Allergies  Review of Systems  Constitutional:  Negative for fever.  HENT:  Negative for congestion, ear pain, hearing loss, sinus pain and sore throat.   Eyes:  Negative for blurred vision and pain.  Respiratory:  Negative for cough, sputum production, shortness of breath and wheezing.   Cardiovascular:  Positive for leg swelling. Negative for chest pain and palpitations.  Gastrointestinal:  Negative for blood in stool, constipation, diarrhea, nausea and vomiting.  Genitourinary:  Negative for dysuria,  frequency, hematuria and urgency.  Musculoskeletal:  Positive for myalgias (left calf). Negative for back pain and falls.  Neurological:  Negative for dizziness, sensory change, loss of consciousness, weakness and headaches.  Endo/Heme/Allergies:  Negative for environmental allergies. Does not bruise/bleed easily.  Psychiatric/Behavioral:  Negative for depression and suicidal ideas. The patient is not nervous/anxious and does not have insomnia.       Objective:    Physical Exam Vitals and nursing note reviewed.  Constitutional:      General: She is not in acute distress.    Appearance: Normal appearance. She is not ill-appearing.  HENT:     Head: Normocephalic and atraumatic.     Right Ear: External ear normal.     Left Ear: External ear normal.  Eyes:     Extraocular Movements: Extraocular movements intact.     Pupils: Pupils are equal, round, and reactive to light.  Cardiovascular:     Rate and Rhythm: Normal rate and regular rhythm.     Pulses: Normal pulses.     Heart sounds: Normal heart sounds. No murmur heard.   No gallop.  Pulmonary:     Effort: Pulmonary effort is normal. No respiratory distress.     Breath sounds: Normal breath sounds. No wheezing, rhonchi or rales.  Abdominal:     General: Bowel sounds are normal. There is no distension.     Palpations: Abdomen is soft. There is no mass.     Tenderness: There is no abdominal tenderness. There is no guarding or rebound.     Hernia: No hernia is present.  Musculoskeletal:        General: No tenderness.     Cervical back: Normal range of motion and neck supple.     Left lower leg: Edema present.     Comments: Some tenderness behind L knee--- min swelling LLE  no calf pain  Lymphadenopathy:     Cervical: No cervical adenopathy.  Skin:    General: Skin is warm and dry.  Neurological:     Mental Status: She is alert and oriented to person, place, and time.  Psychiatric:        Mood and Affect: Mood normal.         Behavior: Behavior normal.        Thought Content: Thought content normal.        Judgment: Judgment normal.    BP 124/80 (BP Location: Left Arm, Patient Position: Sitting, Cuff Size: Normal)   Pulse 60   Temp 98.1  F (36.7 C) (Oral)   Resp 18   Ht 5\' 5"  (1.651 m)   Wt 173 lb 9.6 oz (78.7 kg)   SpO2 97%   BMI 28.89 kg/m  Wt Readings from Last 3 Encounters:  07/05/21 173 lb 9.6 oz (78.7 kg)  11/16/20 178 lb 12.8 oz (81.1 kg)  09/30/20 174 lb (78.9 kg)    Diabetic Foot Exam - Simple   No data filed    Lab Results  Component Value Date   WBC 5.6 11/18/2020   HGB 12.1 11/18/2020   HCT 36.8 11/18/2020   PLT 250.0 11/18/2020   GLUCOSE 103 (H) 11/18/2020   CHOL 220 (H) 11/18/2020   TRIG 85.0 11/18/2020   HDL 58.50 11/18/2020   LDLCALC 144 (H) 11/18/2020   ALT 29 11/18/2020   AST 27 11/18/2020   NA 139 11/18/2020   K 4.5 11/18/2020   CL 103 11/18/2020   CREATININE 0.89 11/18/2020   BUN 13 11/18/2020   CO2 30 11/18/2020   TSH 1.40 11/18/2020   INR 0.9 02/09/2017    Lab Results  Component Value Date   TSH 1.40 11/18/2020   Lab Results  Component Value Date   WBC 5.6 11/18/2020   HGB 12.1 11/18/2020   HCT 36.8 11/18/2020   MCV 92.7 11/18/2020   PLT 250.0 11/18/2020   Lab Results  Component Value Date   NA 139 11/18/2020   K 4.5 11/18/2020   CO2 30 11/18/2020   GLUCOSE 103 (H) 11/18/2020   BUN 13 11/18/2020   CREATININE 0.89 11/18/2020   BILITOT 0.6 11/18/2020   ALKPHOS 122 (H) 11/18/2020   AST 27 11/18/2020   ALT 29 11/18/2020   PROT 6.6 11/18/2020   ALBUMIN 4.1 11/18/2020   CALCIUM 9.4 11/18/2020   GFR 68.42 11/18/2020   Lab Results  Component Value Date   CHOL 220 (H) 11/18/2020   Lab Results  Component Value Date   HDL 58.50 11/18/2020   Lab Results  Component Value Date   LDLCALC 144 (H) 11/18/2020   Lab Results  Component Value Date   TRIG 85.0 11/18/2020   Lab Results  Component Value Date   CHOLHDL 4 11/18/2020   No results  found for: HGBA1C     Assessment & Plan:   Problem List Items Addressed This Visit       Unprioritized   Pain of left calf - Primary    ? Baker cyst --- doubt clot Offered muscle relaxer but pt did not want something that would make her sleepy Check [place] Consider sport med \\or  PT      Relevant Orders   US Venous Img Lower Unilateral Left (DVT)   CBC with Differential/Platelet   Comprehensive metabolic panel    Meds ordered this encounter  Medications   Risankizumab-rzaa (SKYRIZI) 150 MG/ML SOSY    Sig: Inject 1 mL into the skin every 3 (three) months.    Dispense:  0.56 mL     I,Zite Okoli,acting as a scribe for [employer], DO.,have documented all relevant documentation on the behalf of Ann Held, DO,as directed by  Ann Held, DO while in the presence of Ann Held, DO.   I, Ann Held, DO., personally preformed the services described in this documentation.  All medical record entries made by the scribe were at my direction and in my presence.  I have reviewed the chart and discharge instructions (if applicable) and agree that  the record reflects my personal performance and is accurate and complete. 07/05/2021

## 2021-07-05 NOTE — Assessment & Plan Note (Signed)
?   Baker cyst --- doubt clot Offered muscle relaxer but pt did not want something that would make her sleepy Check [place] Consider sport med \\or  PT

## 2021-07-06 ENCOUNTER — Ambulatory Visit (HOSPITAL_BASED_OUTPATIENT_CLINIC_OR_DEPARTMENT_OTHER)
Admission: RE | Admit: 2021-07-06 | Discharge: 2021-07-06 | Disposition: A | Discharge status: Home / Self Care | Payer: BC Managed Care – PPO | Source: Ambulatory Visit | Attending: Family Medicine | Admitting: Family Medicine

## 2021-07-06 DIAGNOSIS — M79662 Pain in left lower leg: Secondary | ICD-10-CM | POA: Insufficient documentation

## 2021-07-06 LAB — COMPREHENSIVE METABOLIC PANEL
ALT: 16 U/L (ref 0–35)
AST: 20 U/L (ref 0–37)
Albumin: 4.2 g/dL (ref 3.5–5.2)
Alkaline Phosphatase: 116 U/L (ref 39–117)
BUN: 12 mg/dL (ref 6–23)
CO2: 30 mEq/L (ref 19–32)
Calcium: 9.5 mg/dL (ref 8.4–10.5)
Chloride: 103 mEq/L (ref 96–112)
Creatinine, Ser: 1.17 mg/dL (ref 0.40–1.20)
GFR: 49.06 mL/min — ABNORMAL LOW (ref >60.00)
Glucose, Bld: 93 mg/dL (ref 70–99)
Potassium: 4.1 mEq/L (ref 3.5–5.1)
Sodium: 140 mEq/L (ref 135–145)
Total Bilirubin: 0.5 mg/dL (ref 0.2–1.2)
Total Protein: 6.6 g/dL (ref 6.0–8.3)

## 2021-07-06 LAB — CBC WITH DIFFERENTIAL/PLATELET
Basophils Absolute: 0.1 10*3/uL (ref 0.0–0.1)
Basophils Relative: 1.3 % (ref 0.0–3.0)
Eosinophils Absolute: 0.1 10*3/uL (ref 0.0–0.7)
Eosinophils Relative: 1.2 % (ref 0.0–5.0)
HCT: 38.7 % (ref 36.0–46.0)
Hemoglobin: 12.6 g/dL (ref 12.0–15.0)
Lymphocytes Relative: 31.4 % (ref 12.0–46.0)
Lymphs Abs: 1.7 10*3/uL (ref 0.7–4.0)
MCHC: 32.6 g/dL (ref 30.0–36.0)
MCV: 91.1 fl (ref 78.0–100.0)
Monocytes Absolute: 0.5 10*3/uL (ref 0.1–1.0)
Monocytes Relative: 9.7 % (ref 3.0–12.0)
Neutro Abs: 3 10*3/uL (ref 1.4–7.7)
Neutrophils Relative %: 56.4 % (ref 43.0–77.0)
Platelets: 225 10*3/uL (ref 150.0–400.0)
RBC: 4.25 Mil/uL (ref 3.87–5.11)
RDW: 13.9 % (ref 11.5–15.5)
WBC: 5.4 10*3/uL (ref 4.0–10.5)

## 2021-07-20 ENCOUNTER — Ambulatory Visit: Payer: BC Managed Care – PPO | Attending: Internal Medicine

## 2021-07-20 DIAGNOSIS — Z23 Encounter for immunization: Secondary | ICD-10-CM

## 2021-07-20 NOTE — Progress Notes (Signed)
   Covid-19 Vaccination Clinic  Name:  Desiree Rosario    MRN: [id number] DOB: 1955-12-26  07/20/2021  Desiree Rosario was observed post Covid-19 immunization for 15 minutes without incident. She was provided with Vaccine Information Sheet and instruction to access the V-Safe system.   Desiree Rosario was instructed to call 911 with any severe reactions post vaccine: Difficulty breathing  Swelling of face and throat  A fast heartbeat  A bad rash all over body  Dizziness and weakness   Immunizations Administered     Name Date Dose VIS Date Route   Pfizer Covid-19 Vaccine Bivalent Booster 07/20/2021  2:54 PM 0.3 mL 05/26/2021 Intramuscular   Manufacturer: [place]   Lot: [id number]   [place]: [phone]

## 2021-08-13 ENCOUNTER — Other Ambulatory Visit (HOSPITAL_BASED_OUTPATIENT_CLINIC_OR_DEPARTMENT_OTHER): Payer: Self-pay

## 2021-08-13 MED ORDER — PFIZER COVID-19 VAC BIVALENT 30 MCG/0.3ML IM SUSP
INTRAMUSCULAR | 0 refills | Status: DC
  Filled 2021-08-13: qty 0.3, 1d supply, fill #0

## 2021-09-23 ENCOUNTER — Encounter: Payer: Self-pay | Admitting: Internal Medicine

## 2021-09-24 ENCOUNTER — Encounter: Payer: Self-pay | Admitting: Internal Medicine

## 2021-09-24 ENCOUNTER — Ambulatory Visit (INDEPENDENT_AMBULATORY_CARE_PROVIDER_SITE_OTHER): Payer: BC Managed Care – PPO | Admitting: Internal Medicine

## 2021-09-24 VITALS — BP 116/72 | HR 57 | Temp 98.2°F | Resp 16 | Ht 65.0 in | Wt 176.2 lb

## 2021-09-24 DIAGNOSIS — J029 Acute pharyngitis, unspecified: Secondary | ICD-10-CM | POA: Diagnosis not present

## 2021-09-24 NOTE — Patient Instructions (Signed)
Start taking Astepro.  It is a nasal spray over-the-counter, 2 sprays on each side of the nose every day until you feel better.   If the sore throat is not gradually improving let me know.  If you are not back to normal in 2 weeks, we will refer you to the ear nose and throat doctor, you will not need to come back to the clinic, just give [place] a phone call.

## 2021-09-24 NOTE — Progress Notes (Signed)
Subjective:    Patient ID: Desiree Rosario, female    DOB: [birth year], 65 y.o.   MRN: [id number]  DOS:  09/24/2021 Type of visit - description: acute  Symptoms started few weeks ago, has a mild discomfort when she swallows, only on the left side. When she looks at the mirror, she sees a white "line" there.  She denies fever chills No cough No sneezing She admits to a runny nose and some postnasal dripping.  That triggered frequent throat clearing. GERD symptoms well controlled.  Review of Systems See above   Past Medical History:  Diagnosis Date   Allergy    Cervical radiculopathy    Cervical spondylolysis    Chronic headaches    GERD (gastroesophageal reflux disease)    History of colon polyps    Hyperlipidemia    IBS (irritable bowel syndrome)    Menopausal vasomotor syndrome 10/27/2017   Obesity    SCC (squamous cell carcinoma), face     Past Surgical History:  Procedure Laterality Date   ABLATION     BREAST LUMPECTOMY     fibroadenomas   FACET JOINT INJECTION  2018   neck   LEG SURGERY     RT- fracture   ROTATOR CUFF REPAIR Right     Allergies as of 09/24/2021   No Known Allergies      Medication List        Accurate as of September 24, 2021 11:59 PM. If you have any questions, ask your nurse or doctor.          STOP taking these medications    Pfizer COVID-19 Vac Bivalent injection Generic drug: COVID-19 mRNA bivalent vaccine Therapist, music) Stopped by: Kathlene November, MD   Vitamin D (Ergocalciferol) 1.25 MG (50000 UNIT) Caps capsule Commonly known as: DRISDOL Stopped by: Kathlene November, MD       TAKE these medications    azelastine 0.1 % nasal spray Commonly known as: ASTELIN Place 2 sprays into both nostrils at bedtime as needed for rhinitis. Use in each nostril as directed   folic acid 1 MG tablet Commonly known as: FOLVITE Take 1 mg by mouth daily.   pantoprazole 40 MG tablet Commonly known as: PROTONIX Take 40 mg by mouth daily.   Skyrizi  150 MG/ML Sosy Generic drug: Risankizumab-rzaa Inject 1 mL into the skin every 3 (three) months.           Objective:   Physical Exam BP 116/72 (BP Location: Left Arm, Patient Position: Sitting, Cuff Size: Small)    Pulse (!) 57    Temp 98.2 F (36.8 C) (Oral)    Resp 16    Ht 5\' 5"  (1.651 m)    Wt 176 lb 4 oz (79.9 kg)    SpO2 98%    BMI 29.33 kg/m  General:   Well developed, NAD, BMI noted. HEENT:  Normocephalic . Face symmetric, atraumatic. TMs: R slightly bulged but not red. L normal. Nose: Minimal congestion Throat: Uvula and the rest of the throat including the pharyngeal walls look normal with no redness, no white patches. Tonsils not seen (small). Neck: No lymphadenopathies. Lungs:  CTA B Normal respiratory effort, no intercostal retractions, no accessory muscle use. Heart: RRR,  no murmur.  Lower extremities: no pretibial edema bilaterally  Skin: Not pale. Not jaundice Neurologic:  alert & oriented X3.  Speech normal, gait appropriate for age and unassisted Psych--  Cognition and judgment appear intact.  Cooperative with normal attention span and concentration.  Behavior appropriate. No anxious or depressed appearing.      Assessment      ASSESSMENT new patient 07-2019, referred by husband Lavan Hyperlipidemia GERD, IBS-diarrhea predominant, Dermatology, Dr Melina Copa --Psoriasis (MTX) --Skin cancer, SCC MSK:  History of neck pain, has seen pain management before Menopausal: used to take paxil OSA: dx via [facility] 05/2020 , Dr Maxwell Caul, on a Cpap   PLAN Sore throat: As described above, exam is benign, she is not a smoker, GERD symptoms controlled.  She does have some postnasal dripping and clears her throat frequently Plan: Throat culture, treat if appropriate.  Astepro until better, if not improving let me know, ENT referral ?.  See AVS  This visit occurred during the SARS-CoV-2 public health emergency.  Safety protocols were in place, including screening  questions prior to the visit, additional usage of staff PPE, and extensive cleaning of exam room while observing appropriate contact time as indicated for disinfecting solutions.

## 2021-09-26 LAB — CULTURE, GROUP A STREP
MICRO NUMBER:: 12813468
SPECIMEN QUALITY:: ADEQUATE

## 2021-09-27 ENCOUNTER — Other Ambulatory Visit (HOSPITAL_COMMUNITY): Payer: Self-pay

## 2021-09-27 NOTE — Assessment & Plan Note (Signed)
Sore throat: As described above, exam is benign, she is not a smoker, GERD symptoms controlled.  She does have some postnasal dripping and clears her throat frequently Plan: Throat culture, treat if appropriate.  Astepro until better, if not improving let me know, ENT referral ?.  See AVS

## 2021-11-26 ENCOUNTER — Encounter: Payer: Self-pay | Admitting: Internal Medicine

## 2022-08-29 ENCOUNTER — Telehealth: Payer: Self-pay | Admitting: Internal Medicine

## 2022-08-29 NOTE — Telephone Encounter (Signed)
Patient would like to get a referral for another ortho surgeon to get a second opinion regarding her shoulder. Advised patient an appt might be needed since she has not been here since Dec of last year. Please advise.

## 2022-08-30 NOTE — Telephone Encounter (Signed)
Agree, needs appt please.

## 2022-08-31 ENCOUNTER — Ambulatory Visit (INDEPENDENT_AMBULATORY_CARE_PROVIDER_SITE_OTHER): Payer: BC Managed Care – PPO | Admitting: Family Medicine

## 2022-08-31 ENCOUNTER — Encounter: Payer: Self-pay | Admitting: Family Medicine

## 2022-08-31 VITALS — BP 129/55 | HR 73 | Temp 98.1°F | Ht 64.0 in | Wt 180.2 lb

## 2022-08-31 DIAGNOSIS — M25511 Pain in right shoulder: Secondary | ICD-10-CM

## 2022-08-31 DIAGNOSIS — G8929 Other chronic pain: Secondary | ICD-10-CM

## 2022-08-31 NOTE — Progress Notes (Signed)
   Acute Office Visit  Subjective:     Patient ID: Desiree Rosario, female    DOB: 1956/08/25, 66 y.o.   MRN: [id number]  Chief Complaint  Patient presents with   Referral    Right shoulder     HPI  Last January she had right shoulder surgery (Dr. Dalphine Handing). States that ever since then it just hasn't felt right. Currently it feels worse than what it did before the surgery. He has repeated MRI and done multiple steroid injections over the past few months. She would like a second opinion. States she has already scheduled an appointment at [facility] for next week, she just needs an official referral before going. She is going to take a copy of the MRI to appointment Pain is 8-9/10 at times, more mild today. Pain is worse with lifting, sudden motion and impairs range of motion. She has been taking Meloxicam. Last injection was last Monday     ROS All review of systems negative except what is listed in the HPI      Objective:    BP (!) 129/55   Pulse 73   Temp 98.1 F (36.7 C)   Ht '5\' 4"'$  (1.626 m)   Wt 180 lb 3.2 oz (81.7 kg)   SpO2 100%   BMI 30.93 kg/m    Physical Exam Vitals reviewed.  Constitutional:      Appearance: Normal appearance.  Musculoskeletal:        General: No swelling or tenderness.     Comments: Right shoulder with limited range of motion  Neurological:     General: No focal deficit present.     Mental Status: She is alert and oriented to person, place, and time. Mental status is at baseline.  Psychiatric:        Mood and Affect: Mood normal.        Behavior: Behavior normal.        Thought Content: Thought content normal.        Judgment: Judgment normal.     No results found for any visits on 08/31/22.      Assessment & Plan:   Problem List Items Addressed This Visit   None Visit Diagnoses     Chronic right shoulder pain    -  Primary Referral placed for second opinion.    Relevant Orders   Ambulatory referral to  Orthopedic Surgery       No orders of the defined types were placed in this encounter.   Return if symptoms worsen or fail to improve.  Terrilyn Saver, NP

## 2022-09-26 ENCOUNTER — Ambulatory Visit
Admission: EM | Admit: 2022-09-26 | Discharge: 2022-09-26 | Disposition: A | Discharge status: Home / Self Care | Payer: BC Managed Care – PPO | Attending: Family Medicine | Admitting: Family Medicine

## 2022-09-26 DIAGNOSIS — Z20818 Contact with and (suspected) exposure to other bacterial communicable diseases: Secondary | ICD-10-CM

## 2022-09-26 DIAGNOSIS — J029 Acute pharyngitis, unspecified: Secondary | ICD-10-CM | POA: Diagnosis not present

## 2022-09-26 MED ORDER — AMOXICILLIN 875 MG PO TABS: 875.0000 mg | ORAL_TABLET | Freq: Two times a day (BID) | ORAL | 0 refills | Status: AC

## 2022-09-26 NOTE — ED Provider Notes (Signed)
[facility]    CSN: [id number] Arrival date & time: 09/26/22  1005      History   Chief Complaint Chief Complaint  Patient presents with   Sore Throat    HPI Desiree Rosario is a 67 y.o. female.   HPI  Patient is here for severe sore throat.  She was exposed to her granddaughter with strep throat on Friday.  She had a little runny nose and thought she had allergies Friday and Saturday, yesterday developed a sore throat.  Today it is painful.  Has had chills but no fever.  She did a COVID test which was negative  Past Medical History:  Diagnosis Date   Allergy    Cervical radiculopathy    Cervical spondylolysis    Chronic headaches    GERD (gastroesophageal reflux disease)    History of colon polyps    Hyperlipidemia    IBS (irritable bowel syndrome)    Menopausal vasomotor syndrome 10/27/2017   Obesity    SCC (squamous cell carcinoma), face     Patient Active Problem List   Diagnosis Date Noted   Pain of left calf 07/05/2021   Annual physical exam 08/23/2019   PCP NOTES >>>>>>>> 08/23/2019   GERD (gastroesophageal reflux disease) 08/23/2019   Psoriasis 08/23/2019   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   Facial paresthesia 09/09/2018   Bilateral breast cysts 09/09/2018   Chronic headaches    Cervical spondylolysis    Upper airway cough syndrome 10/27/2017   Menopausal vasomotor syndrome 10/27/2017   Carpal tunnel syndrome, left 09/15/2017   Irritable bowel syndrome with both constipation and diarrhea 02/10/2017   Sacral back pain 02/10/2017   Tubular adenoma of colon 02/10/2017   Hyperlipidemia 11/23/2015   Squamous cell carcinoma, face 11/20/2015   History of colonic polyps 11/20/2015   Allergic rhinitis 11/13/2015   Bilateral foot pain 06/13/2013    Past Surgical History:  Procedure Laterality Date   ABLATION     BREAST LUMPECTOMY     fibroadenomas   FACET JOINT INJECTION  2018   neck   LEG SURGERY     RT- fracture   ROTATOR  CUFF REPAIR Right     OB History   No obstetric history on file.      Home Medications    Prior to Admission medications   Medication Sig Start Date End Date Taking? Authorizing Provider  amoxicillin (AMOXIL) 875 MG tablet Take 1 tablet (875 mg total) by mouth 2 (two) times daily for 10 days. 09/26/22 10/06/22 Yes Raylene Everts, MD  azelastine (ASTELIN) 0.1 % nasal spray Place 2 sprays into both nostrils at bedtime as needed for rhinitis. Use in each nostril as directed 08/21/19   Colon Branch, MD  folic acid (FOLVITE) 1 MG tablet Take 1 mg by mouth daily. 11/05/18   [provider]  pantoprazole (PROTONIX) 40 MG tablet Take 40 mg by mouth daily.    [provider]  Risankizumab-rzaa (SKYRIZI) 150 MG/ML SOSY Inject 1 mL into the skin every 3 (three) months. 07/05/21   Ann Held, DO    Family History Family History  Problem Relation Age of Onset   Cancer Mother        melanoma   Diabetes Father    Stroke Maternal Grandmother    Colon cancer Neg Hx    Breast cancer Neg Hx    CAD Neg Hx     Social History Social History   Tobacco  Use   Smoking status: Never   Smokeless tobacco: Never  Vaping Use   Vaping Use: Never used  Substance Use Topics   Alcohol use: No   Drug use: No     Allergies   Patient has no known allergies.   Review of Systems Review of Systems  See HPI Physical Exam Triage Vital Signs ED Triage Vitals  Enc Vitals Group     BP 09/26/22 1016 (!) 153/87     Pulse Rate 09/26/22 1016 61     Resp 09/26/22 1016 12     Temp 09/26/22 1016 98.1 F (36.7 C)     Temp Source 09/26/22 1016 Oral     SpO2 09/26/22 1016 98 %     Weight --      Height --      Head Circumference --      Peak Flow --      Pain Score 09/26/22 1015 3     Pain Loc --      Pain Edu? --      Excl. in [place]? --    No data found.  Updated Vital Signs BP (!) 153/87 (BP Location: Right Arm)   Pulse 61   Temp 98.1 F (36.7 C) (Oral)   Resp 12    SpO2 98%       Physical Exam Constitutional:      General: She is not in acute distress.    Appearance: She is well-developed. She is ill-appearing.  HENT:     Head: Normocephalic and atraumatic.     Right Ear: Tympanic membrane and ear canal normal.     Left Ear: Tympanic membrane and ear canal normal.     Nose: Congestion present.     Mouth/Throat:     Pharynx: Uvula midline. Pharyngeal swelling and posterior oropharyngeal erythema present. No uvula swelling.     Tonsils: No tonsillar exudate. 2+ on the right. 2+ on the left.  Eyes:     Conjunctiva/sclera: Conjunctivae normal.     Pupils: Pupils are equal, round, and reactive to light.  Cardiovascular:     Rate and Rhythm: Normal rate.  Pulmonary:     Effort: Pulmonary effort is normal. No respiratory distress.  Abdominal:     General: There is no distension.     Palpations: Abdomen is soft.  Musculoskeletal:        General: Normal range of motion.     Cervical back: Normal range of motion.  Lymphadenopathy:     Cervical: Cervical adenopathy present.  Skin:    General: Skin is warm and dry.  Neurological:     Mental Status: She is alert.      UC Treatments / Results  Labs (all labs ordered are listed, but only abnormal results are displayed) Labs Reviewed - No data to display  EKG   Radiology No results found.  Procedures Procedures (including critical care time)  Medications Ordered in UC Medications - No data to display  Initial Impression / Assessment and Plan / UC Course  I have reviewed the triage vital signs and the nursing notes.  Pertinent labs & imaging results that were available during my care of the patient were reviewed by me and considered in my medical decision making (see chart for details).     Patient had close exposure to a strep throat and now has a sore throat.  I think that it is prudent to treat her, therefore strep testing was not done Final Clinical  Impressions(s) / UC  Diagnoses   Final diagnoses:  Acute pharyngitis, unspecified etiology  Streptococcus group A exposure     Discharge Instructions      May continue your Astelin nasal spray Drink lots of water Take the amoxicillin 2 times a day for 10 full days See your doctor if not improving by next week   ED Prescriptions     Medication Sig Dispense Auth. Provider   amoxicillin (AMOXIL) 875 MG tablet Take 1 tablet (875 mg total) by mouth 2 (two) times daily for 10 days. 20 tablet Raylene Everts, MD      PDMP not reviewed this encounter.   Raylene Everts, MD 09/26/22 1257

## 2022-09-26 NOTE — ED Triage Notes (Signed)
Pt presents with c/o sore throat that began last night after having cold like sx that began Friday.

## 2022-09-26 NOTE — Discharge Instructions (Addendum)
May continue your Astelin nasal spray Drink lots of water Take the amoxicillin 2 times a day for 10 full days See your doctor if not improving by next week

## 2022-12-01 IMAGING — DX DG CHEST 2V
2 series · 2 of 2 positions shown · non-contrast
Comparison: 04/06/2018

CLINICAL DATA: Left upper quadrant pain

EXAM:
CHEST - 2 VIEW

[chest pa]
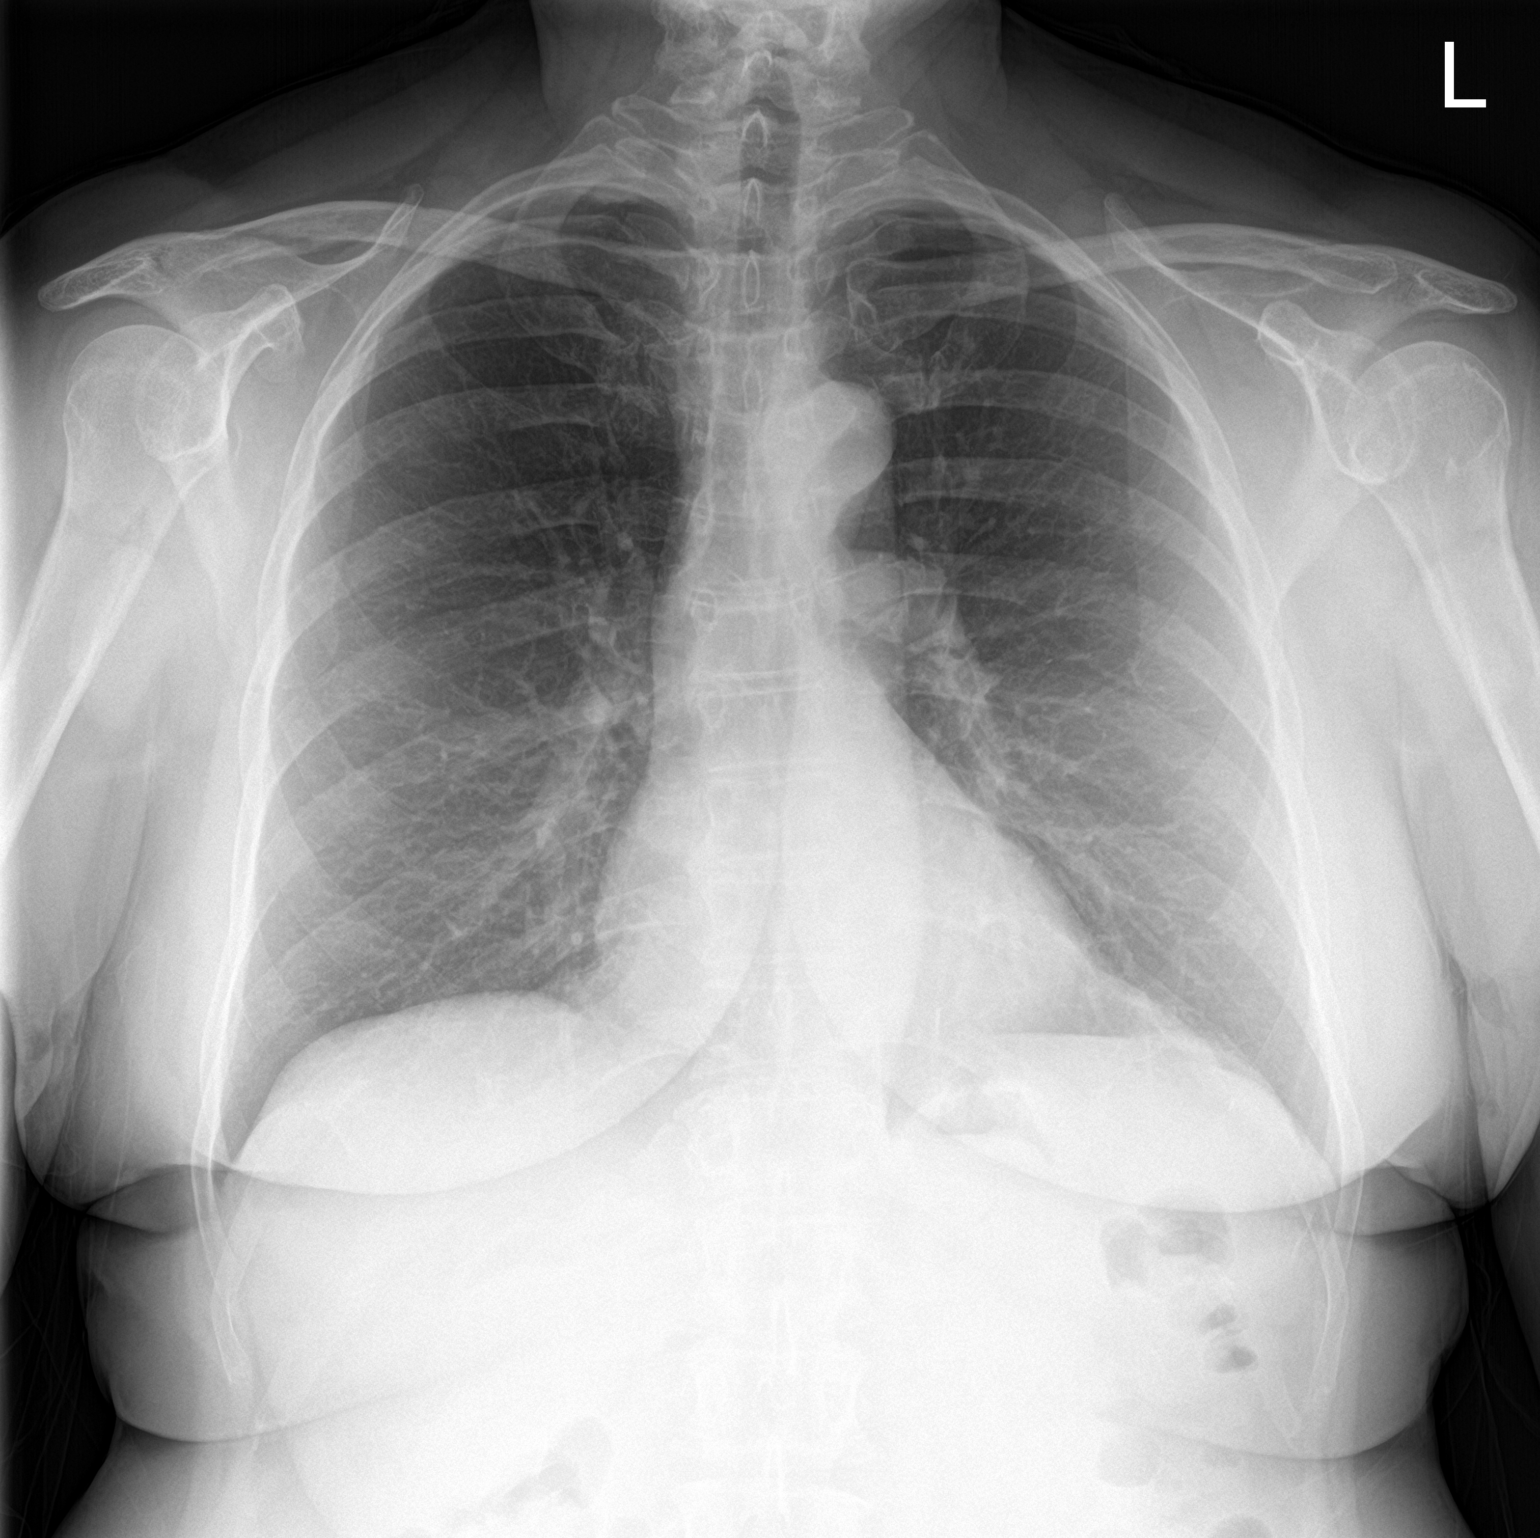

[chest lat]
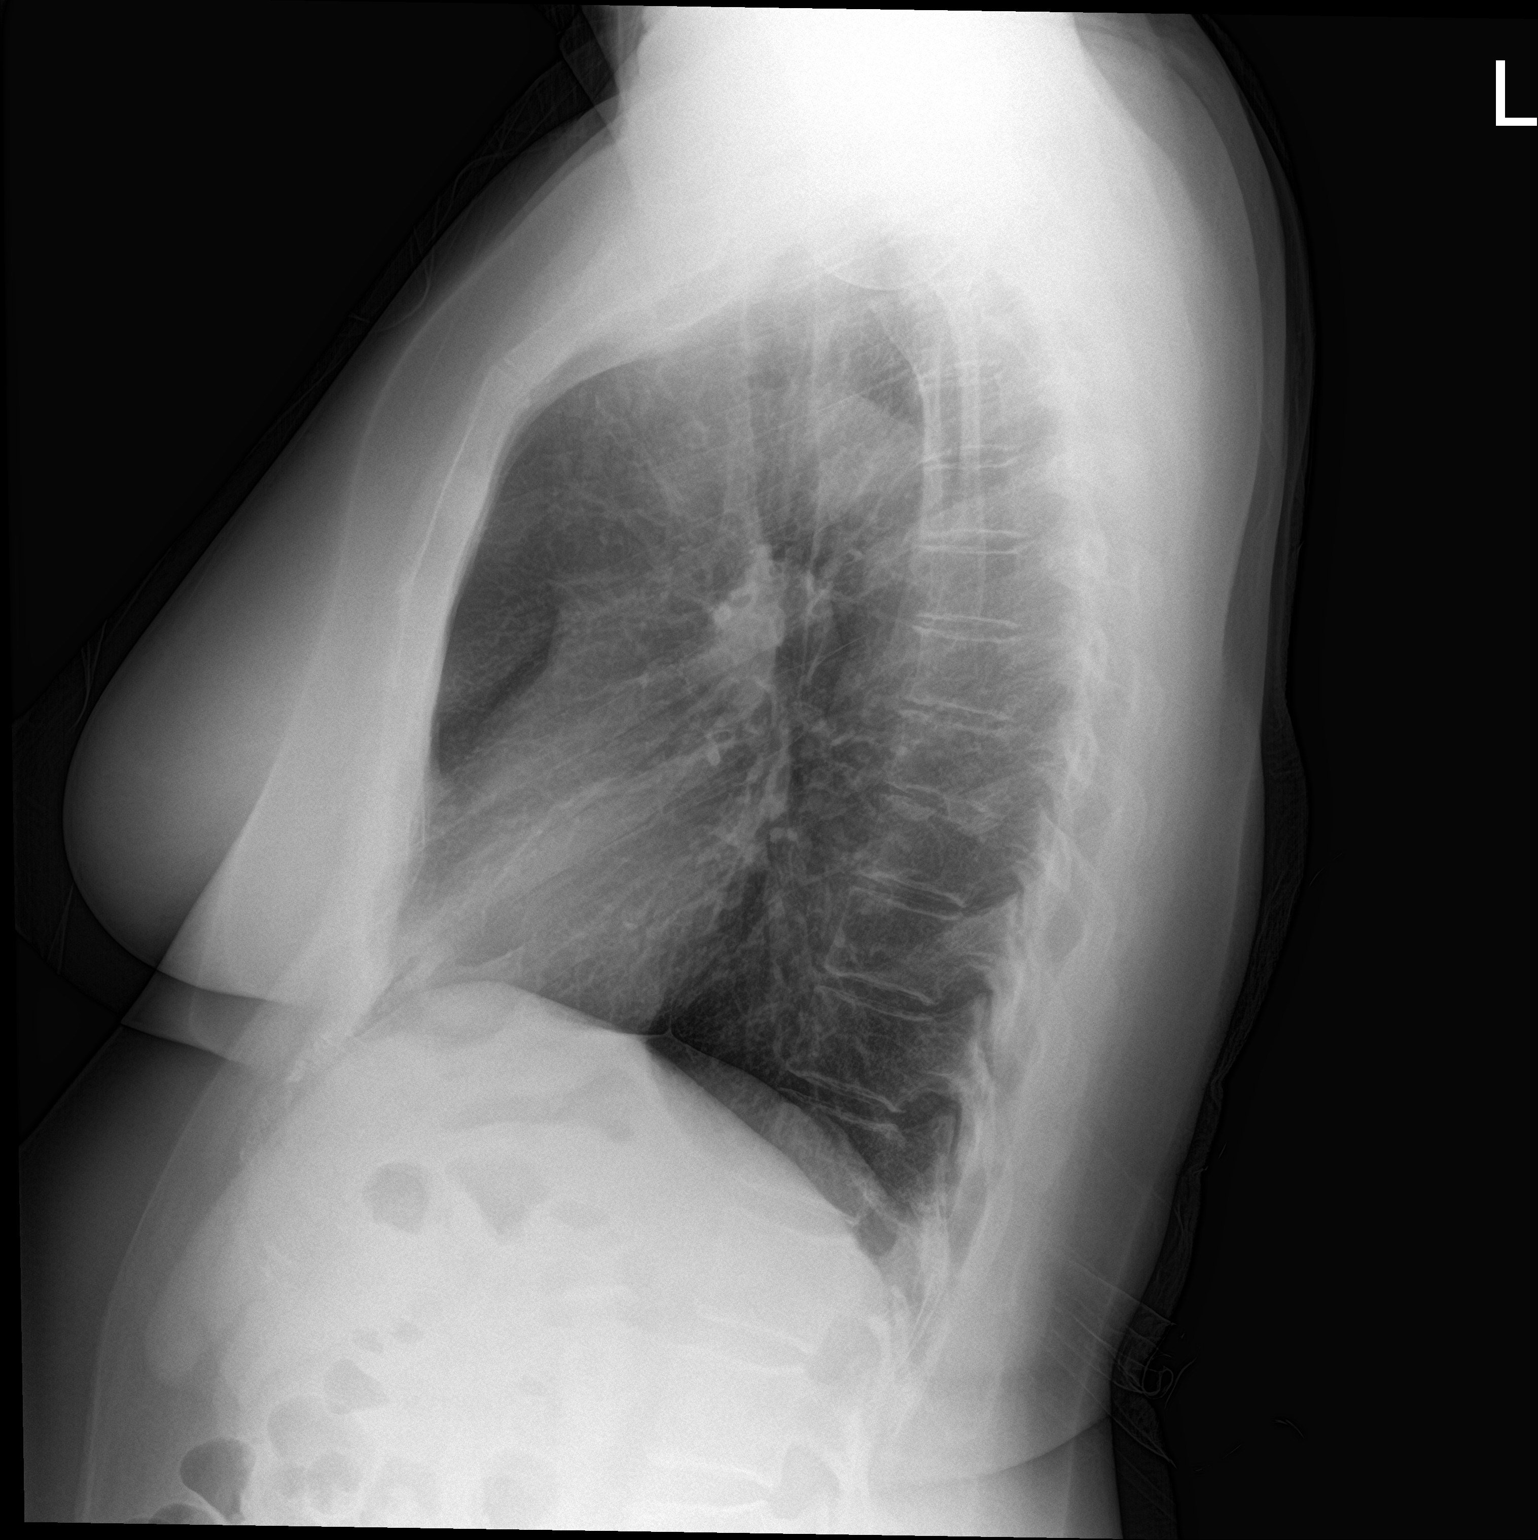

[2 of 2 positions shown; findings below may reference images not displayed]

FINDINGS: The heart size and mediastinal contours are within normal limits.
Both lungs are clear. The visualized skeletal structures are
unremarkable.
IMPRESSION: No active cardiopulmonary disease.

## 2023-01-06 ENCOUNTER — Encounter: Payer: Self-pay | Admitting: Internal Medicine

## 2023-01-17 NOTE — Progress Notes (Unsigned)
Subjective:      HPI: Pt is a 67 y.o. female who is here for preoperative clearance for right shoulder replacement - Dr. Everardo Pacific ([facility]). She is hoping for surgery date of Feb 08, 2023.  1) High Risk Cardiac Conditions:  1) Recent MI - No.  2) Decompensated Heart Failure - No.  3) Unstable angina - No.  4) Symptomatic arrythmia - No.  5) Sx Valvular Disease - No.  2) Intermediate Risk Factors: DM, CKD, CVA, CHF, CAD - No.  2) Functional Status: > 4 mets (Walk, run, climb stairs) Yes. [facility] Activity Status Index: 58.2  3) Surgery Specific Risk:           Intermediate (Carotid, Head and Neck, Orthopaedic )            4) Further Noninvasive evaluation:   1) EKG - SB 50 bpm, low voltage in precordial leads. Asymptomatic. Discussed with PCP. No further workup at this time.     5) Need for medical therapy - Beta Blocker, Statins indicated ? No.  New complaints: none  Social history:  Relevant past medical, surgical, family and social history reviewed and updated as indicated. Interim medical history since our last visit reviewed.  Allergies and medications reviewed and updated.  DATA REVIEWED: CHART IN EPIC  ROS: Negative unless specifically indicated above in HPI.    Current Outpatient Medications:    azelastine (ASTELIN) 0.1 % nasal spray, Place 2 sprays into both nostrils at bedtime as needed for rhinitis. Use in each nostril as directed, Disp: 30 mL, Rfl: 6   folic acid (FOLVITE) 1 MG tablet, Take 1 mg by mouth daily., Disp: , Rfl:    Risankizumab-rzaa (SKYRIZI) 150 MG/ML SOSY, Inject 1 mL into the skin every 3 (three) months., Disp: 0.56 mL, Rfl:       Objective:    BP 137/71   Pulse (!) 56   Ht  (1.626 m)   Wt 181 lb (82.1 kg)   SpO2 100%   BMI 31.07 kg/m   Wt Readings from Last 3 Encounters:  01/18/23 181 lb (82.1 kg)  08/31/22 180 lb 3.2 oz (81.7 kg)  09/24/21 176 lb 4 oz (79.9 kg)    Physical Exam Vitals reviewed.   Constitutional:      General: She is not in acute distress.    Appearance: Normal appearance. She is not ill-appearing.  Neck:     Vascular: No carotid bruit.  Cardiovascular:     Rate and Rhythm: Normal rate and regular rhythm.     Pulses: Normal pulses.     Heart sounds: Normal heart sounds.  Pulmonary:     Effort: Pulmonary effort is normal.     Breath sounds: Normal breath sounds.  Musculoskeletal:     Cervical back: Normal range of motion and neck supple. No tenderness.  Lymphadenopathy:     Cervical: No cervical adenopathy.  Skin:    General: Skin is warm and dry.  Neurological:     Mental Status: She is alert and oriented to person, place, and time.  Psychiatric:        Mood and Affect: Mood normal.        Behavior: Behavior normal.        Thought Content: Thought content normal.        Judgment: Judgment normal.         Assessment & Plan:  Preoperative clearance -     EKG 12-Lead -  CBC with Differential/Platelet -     Comprehensive metabolic panel    I have independently evaluated patient.  TERRILYNN POSTELL is a 67 y.o. female who is low risk for an intermediate risk surgery.  There are not modifiable risk factors (smoking, etc). Charliene Inoue Mccrory's RCRI/NSQIP calculation for MACE is: 0.    She has not had a dose of Skyrizi in several months and will plan to hold temporarily after surgery - working with dermatology.     Return if symptoms worsen or fail to improve.  Lollie Marrow Reola Calkins, DNP, FNP-C

## 2023-01-18 ENCOUNTER — Encounter: Payer: Self-pay | Admitting: Family Medicine

## 2023-01-18 ENCOUNTER — Ambulatory Visit (INDEPENDENT_AMBULATORY_CARE_PROVIDER_SITE_OTHER): Payer: BC Managed Care – PPO | Admitting: Family Medicine

## 2023-01-18 VITALS — BP 137/71 | HR 56 | Ht 64.0 in | Wt 181.0 lb

## 2023-01-18 DIAGNOSIS — Z01818 Encounter for other preprocedural examination: Secondary | ICD-10-CM

## 2023-01-18 LAB — CBC WITH DIFFERENTIAL/PLATELET
Basophils Absolute: 0 10*3/uL (ref 0.0–0.1)
Basophils Relative: 1 % (ref 0.0–3.0)
Eosinophils Absolute: 0.1 10*3/uL (ref 0.0–0.7)
Eosinophils Relative: 2.5 % (ref 0.0–5.0)
HCT: 40 % (ref 36.0–46.0)
Hemoglobin: 13.2 g/dL (ref 12.0–15.0)
Lymphocytes Relative: 30.9 % (ref 12.0–46.0)
Lymphs Abs: 1.5 10*3/uL (ref 0.7–4.0)
MCHC: 33.1 g/dL (ref 30.0–36.0)
MCV: 91.5 fl (ref 78.0–100.0)
Monocytes Absolute: 0.4 10*3/uL (ref 0.1–1.0)
Monocytes Relative: 9 % (ref 3.0–12.0)
Neutro Abs: 2.7 10*3/uL (ref 1.4–7.7)
Neutrophils Relative %: 56.6 % (ref 43.0–77.0)
Platelets: 250 10*3/uL (ref 150.0–400.0)
RBC: 4.37 Mil/uL (ref 3.87–5.11)
RDW: 13.3 % (ref 11.5–15.5)
WBC: 4.9 10*3/uL (ref 4.0–10.5)

## 2023-01-18 LAB — COMPREHENSIVE METABOLIC PANEL
ALT: 15 U/L (ref 0–35)
AST: 19 U/L (ref 0–37)
Albumin: 4.1 g/dL (ref 3.5–5.2)
Alkaline Phosphatase: 128 U/L — ABNORMAL HIGH (ref 39–117)
BUN: 14 mg/dL (ref 6–23)
CO2: 31 mEq/L (ref 19–32)
Calcium: 9.7 mg/dL (ref 8.4–10.5)
Chloride: 104 mEq/L (ref 96–112)
Creatinine, Ser: 1.06 mg/dL (ref 0.40–1.20)
GFR: 54.64 mL/min — ABNORMAL LOW (ref >60.00)
Glucose, Bld: 95 mg/dL (ref 70–99)
Potassium: 4.4 mEq/L (ref 3.5–5.1)
Sodium: 143 mEq/L (ref 135–145)
Total Bilirubin: 0.4 mg/dL (ref 0.2–1.2)
Total Protein: 6.4 g/dL (ref 6.0–8.3)

## 2023-01-18 NOTE — Patient Instructions (Signed)
EKG stable Labs today No form available - see if they will fax it again

## 2023-01-19 ENCOUNTER — Telehealth: Payer: Self-pay | Admitting: Neurology

## 2023-01-19 NOTE — Telephone Encounter (Signed)
Patient made aware.

## 2023-01-19 NOTE — Telephone Encounter (Signed)
Medical clearance received from [facility]. Completed, faxed with EKG/lab results with confirmation received.

## 2023-01-20 ENCOUNTER — Other Ambulatory Visit: Payer: Self-pay | Admitting: Orthopaedic Surgery

## 2023-01-20 DIAGNOSIS — M25211 Flail joint, right shoulder: Secondary | ICD-10-CM

## 2023-01-23 ENCOUNTER — Ambulatory Visit
Admission: RE | Admit: 2023-01-23 | Discharge: 2023-01-23 | Disposition: A | Discharge status: Home / Self Care | Payer: BC Managed Care – PPO | Source: Ambulatory Visit | Attending: Orthopaedic Surgery | Admitting: Orthopaedic Surgery

## 2023-01-23 DIAGNOSIS — M25211 Flail joint, right shoulder: Secondary | ICD-10-CM

## 2023-02-06 NOTE — Progress Notes (Deleted)
   Acute Office Visit  Subjective:     Patient ID: ARLISS KOEP, female    DOB: 04-13-1956, 67 y.o.   MRN: [id number]  No chief complaint on file.   HPI Patient is in today for ED follow-up.   She went to [facility] ED on 02/05/23 with headache. She reported that she had a headache for 1 week, and ended up having to go to urgent care while on vacation (OBX). States she woke up 3 times with posterior head pain and photosensitivity and nausea. No injury. At urgent care she tested negative for Flu/COVID/RSV and received IV saline 1L, zofran, and toradol. In the [facility] ED she had a CT head/neck which was unremarkable. They gave her tylenol, IB Decadron, Benadryl, Toradol, 1 L lactated ringer's, IV magnesium, and compazine. CBC/BMP unremarkable.   ***       ROS All review of systems negative except what is listed in the HPI      Objective:    There were no vitals taken for this visit. {Vitals History (Optional):23777}  Physical Exam  No results found for any visits on 02/07/23.      Assessment & Plan:   Problem List Items Addressed This Visit   None   No orders of the defined types were placed in this encounter.   No follow-ups on file.  Clayborne Dana, NP

## 2023-02-07 ENCOUNTER — Ambulatory Visit: Payer: BC Managed Care – PPO | Admitting: Family Medicine

## 2023-02-07 ENCOUNTER — Ambulatory Visit (INDEPENDENT_AMBULATORY_CARE_PROVIDER_SITE_OTHER): Payer: BC Managed Care – PPO | Admitting: Family

## 2023-02-07 VITALS — BP 127/55 | HR 53 | Temp 97.8°F | Resp 16 | Wt 182.0 lb

## 2023-02-07 DIAGNOSIS — G43909 Migraine, unspecified, not intractable, without status migrainosus: Secondary | ICD-10-CM | POA: Diagnosis not present

## 2023-02-07 MED ORDER — SUMATRIPTAN SUCCINATE 50 MG PO TABS: 50.0000 mg | ORAL_TABLET | ORAL | 2 refills | Status: AC | PRN

## 2023-02-07 NOTE — Assessment & Plan Note (Signed)
New.  Her brain imaging is reassuring.  Will rx imitrex for prn use.  She will let me know if worsening symptoms or if she does not find relief with imitrex. In that case, would plan referral to neurology.

## 2023-02-07 NOTE — Progress Notes (Signed)
Subjective:   By signing my name below, I, Desiree Rosario, attest that this documentation has been prepared under the direction and in the presence of Lemont Fillers, NP 02/07/23   Patient ID: Desiree Rosario, female    DOB: [birth year], 67 y.o.   MRN: [id number]  Chief Complaint  Patient presents with   Follow-up    Here for ed follow up    HPI Patient is in today for an ED follow up.   Migraine:  She complains of migraines all last week while [place] the [place]. During her vacation she went to urgent care and reports her blood pressure was 178/88. She was given Toradol. On Sunday 5/12 her symptoms worsened and she woke up with severe pain at the back of her head. She went to [facility] and CT angio head and neck was normal. ED record is reviewed in care everywhere. She notes she has had no prior hx of migraines.   She states her migraine worsens with activity such as bending over. She endorses sinus pain and throbbing after sneezing. She has been taking Tylenol, which has not been helping.  Today she notes a mild headache only.    Past Medical History:  Diagnosis Date   Allergy    Cervical radiculopathy    Cervical spondylolysis    Chronic headaches    GERD (gastroesophageal reflux disease)    History of colon polyps    Hyperlipidemia    IBS (irritable bowel syndrome)    Menopausal vasomotor syndrome 10/27/2017   Obesity    SCC (squamous cell carcinoma), face     Past Surgical History:  Procedure Laterality Date   ABLATION     BREAST LUMPECTOMY     fibroadenomas   FACET JOINT INJECTION  2018   neck   LEG SURGERY     RT- fracture   ROTATOR CUFF REPAIR Right     Family History  Problem Relation Age of Onset   Cancer Mother        melanoma   Diabetes Father    Stroke Maternal Grandmother    Colon cancer Neg Hx    Breast cancer Neg Hx    CAD Neg Hx     Social History   Socioeconomic History   Marital status: Married    Spouse name: Not  on file   Number of children: 2   Years of education: Not on file   Highest education level: Not on file  Occupational History   Occupation: works @ [employer]  Tobacco Use   Smoking status: Never   Smokeless tobacco: Never  Building services engineer Use: Never used  Substance and Sexual Activity   Alcohol use: No   Drug use: No   Sexual activity: Not Currently  Other Topics Concern   Not on file  Social History Narrative   Household: Pt and husband   Social Determinants of Corporate investment banker Strain: Not on file  Food Insecurity: Not on file  Transportation Needs: Not on file  Physical Activity: Not on file  Stress: Not on file  Social Connections: Not on file  Intimate Partner Violence: Not on file    Outpatient Medications Prior to Visit  Medication Sig Dispense Refill   azelastine (ASTELIN) 0.1 % nasal spray Place 2 sprays into both nostrils at bedtime as needed for rhinitis. Use in each nostril as directed 30 mL 6   cetirizine (ZYRTEC) 10 MG  tablet Take 1 tablet by mouth daily.     folic acid (FOLVITE) 1 MG tablet Take 1 mg by mouth daily.     Risankizumab-rzaa (SKYRIZI) 150 MG/ML SOSY Inject 1 mL into the skin every 3 (three) months. 0.56 mL    No facility-administered medications prior to visit.    No Known Allergies  Review of Systems  Neurological:  Positive for headaches (migraines).       Objective:    Physical Exam Constitutional:      General: She is not in acute distress.    Appearance: Normal appearance. She is well-developed.  HENT:     Head: Normocephalic and atraumatic.     Right Ear: External ear normal.     Left Ear: External ear normal.  Eyes:     General: No scleral icterus.    Pupils: Pupils are equal, round, and reactive to light.  Neck:     Thyroid: No thyromegaly.  Cardiovascular:     Rate and Rhythm: Normal rate and regular rhythm.     Heart sounds: Normal heart sounds. No murmur heard. Pulmonary:     Effort: Pulmonary  effort is normal. No respiratory distress.     Breath sounds: Normal breath sounds. No wheezing.  Musculoskeletal:     Cervical back: Neck supple.  Skin:    General: Skin is warm and dry.  Neurological:     Mental Status: She is alert and oriented to person, place, and time.     Cranial Nerves: No cranial nerve deficit.     Deep Tendon Reflexes:     Reflex Scores:      Patellar reflexes are 2+ on the right side and 2+ on the left side.    Comments: Bilateral UE/LE strength is 5/5  Psychiatric:        Mood and Affect: Mood normal.        Behavior: Behavior normal.        Thought Content: Thought content normal.        Judgment: Judgment normal.     BP (!) 127/55 (BP Location: Right Arm, Patient Position: Sitting, Cuff Size: Small)   Pulse (!) 53   Temp 97.8 F (36.6 C) (Oral)   Resp 16   Wt 182 lb (82.6 kg)   SpO2 100%   BMI 31.24 kg/m  Wt Readings from Last 3 Encounters:  02/07/23 182 lb (82.6 kg)  01/18/23 181 lb (82.1 kg)  08/31/22 180 lb 3.2 oz (81.7 kg)       Assessment & Plan:  Migraine without status migrainosus, not intractable, unspecified migraine type Assessment & Plan: New.  Her brain imaging is reassuring.  Will rx imitrex for prn use.  She will let me know if worsening symptoms or if she does not find relief with imitrex. In that case, would plan referral to neurology.    Other orders -     SUMAtriptan Succinate; Take 1 tablet (50 mg total) by mouth every 2 (two) hours as needed for migraine. May repeat in 2 hours if headache persists or recurs.  Dispense: 10 tablet; Refill: 2     I,Rachel Rivera,acting as a scribe for Lemont Fillers, NP.,have documented all relevant documentation on the behalf of Lemont Fillers, NP,as directed by  Lemont Fillers, NP while in the presence of Lemont Fillers, NP.   I, Lemont Fillers, NP, personally preformed the services described in this documentation.  All medical record entries made by  the scribe  were at my direction and in my presence.  I have reviewed the chart and discharge instructions (if applicable) and agree that the record reflects my personal performance and is accurate and complete. 02/07/23   Lemont Fillers, NP

## 2023-04-11 ENCOUNTER — Ambulatory Visit: Payer: BC Managed Care – PPO | Admitting: Family Medicine

## 2023-04-11 ENCOUNTER — Encounter: Payer: Self-pay | Admitting: Family Medicine

## 2023-04-11 VITALS — BP 128/73 | HR 59 | Ht 64.0 in | Wt 178.0 lb

## 2023-04-11 DIAGNOSIS — R7989 Other specified abnormal findings of blood chemistry: Secondary | ICD-10-CM

## 2023-04-11 DIAGNOSIS — E782 Mixed hyperlipidemia: Secondary | ICD-10-CM | POA: Diagnosis not present

## 2023-04-11 DIAGNOSIS — R748 Abnormal levels of other serum enzymes: Secondary | ICD-10-CM | POA: Diagnosis not present

## 2023-04-11 DIAGNOSIS — H9313 Tinnitus, bilateral: Secondary | ICD-10-CM

## 2023-04-11 LAB — COMPREHENSIVE METABOLIC PANEL
ALT: 17 U/L (ref 0–35)
AST: 19 U/L (ref 0–37)
Albumin: 4.2 g/dL (ref 3.5–5.2)
Alkaline Phosphatase: 129 U/L — ABNORMAL HIGH (ref 39–117)
BUN: 18 mg/dL (ref 6–23)
CO2: 29 mEq/L (ref 19–32)
Calcium: 9.8 mg/dL (ref 8.4–10.5)
Chloride: 105 mEq/L (ref 96–112)
Creatinine, Ser: 0.84 mg/dL (ref 0.40–1.20)
GFR: 72.11 mL/min (ref >60.00)
Glucose, Bld: 105 mg/dL — ABNORMAL HIGH (ref 70–99)
Potassium: 4.8 mEq/L (ref 3.5–5.1)
Sodium: 139 mEq/L (ref 135–145)
Total Bilirubin: 0.6 mg/dL (ref 0.2–1.2)
Total Protein: 6.5 g/dL (ref 6.0–8.3)

## 2023-04-11 LAB — LIPID PANEL
Cholesterol: 231 mg/dL — ABNORMAL HIGH (ref 0–200)
HDL: 62.7 mg/dL (ref >39.00)
LDL Cholesterol: 149 mg/dL — ABNORMAL HIGH (ref 0–99)
NonHDL: 167.85
Total CHOL/HDL Ratio: 4
Triglycerides: 96 mg/dL (ref 0.0–149.0)
VLDL: 19.2 mg/dL (ref 0.0–40.0)

## 2023-04-11 LAB — CBC WITH DIFFERENTIAL/PLATELET
Basophils Absolute: 0.1 10*3/uL (ref 0.0–0.1)
Basophils Relative: 1.3 % (ref 0.0–3.0)
Eosinophils Absolute: 0.2 10*3/uL (ref 0.0–0.7)
Eosinophils Relative: 5.7 % — ABNORMAL HIGH (ref 0.0–5.0)
HCT: 39.4 % (ref 36.0–46.0)
Hemoglobin: 12.6 g/dL (ref 12.0–15.0)
Lymphocytes Relative: 28.5 % (ref 12.0–46.0)
Lymphs Abs: 1.2 10*3/uL (ref 0.7–4.0)
MCHC: 32.1 g/dL (ref 30.0–36.0)
MCV: 92.1 fl (ref 78.0–100.0)
Monocytes Absolute: 0.4 10*3/uL (ref 0.1–1.0)
Monocytes Relative: 8.4 % (ref 3.0–12.0)
Neutro Abs: 2.4 10*3/uL (ref 1.4–7.7)
Neutrophils Relative %: 56.1 % (ref 43.0–77.0)
Platelets: 225 10*3/uL (ref 150.0–400.0)
RBC: 4.27 Mil/uL (ref 3.87–5.11)
RDW: 13.8 % (ref 11.5–15.5)
WBC: 4.3 10*3/uL (ref 4.0–10.5)

## 2023-04-11 LAB — VITAMIN D 25 HYDROXY (VIT D DEFICIENCY, FRACTURES): VITD: 41.7 ng/mL (ref 30.00–100.00)

## 2023-04-11 NOTE — Progress Notes (Signed)
Acute Office Visit  Subjective:     Patient ID: Desiree Rosario, female    DOB: [birth year], 67 y.o.   MRN: [id number]  Chief Complaint  Patient presents with   Tinnitus    HPI Patient is in today for chronic tinnitus.   Discussed the use of AI scribe software for clinical note transcription with the patient, who gave verbal consent to proceed.  History of Present Illness   The patient has been experiencing tinnitus, described as a constant ringing in both ears. This symptom has been present for several years but has recently become more noticeable and louder. The patient denies any associated pain, hearing loss, dizziness, or imbalance. The tinnitus is particularly noticeable at night when other environmental sounds are minimal. The patient has not started any new medications around the time the tinnitus worsened. They have not seen an ear, nose, and throat specialist for this issue before. The patient also reports a constant, mild sinus drainage.  In addition to the tinnitus, the patient has been undergoing physical therapy for a shoulder issue, which is reportedly improving. They are also preparing for retirement. The patient has been taking vitamin D and calcium supplements for a previously diagnosed vitamin D deficiency. They have been fasting in preparation/hope for blood work today. The patient's medical history includes high cholesterol and elevated alk phos levels.         ROS All review of systems negative except what is listed in the HPI      Objective:    BP 128/73   Pulse (!) 59   Ht 5\' 4"  (1.626 m)   Wt 178 lb (80.7 kg)   SpO2 99%   BMI 30.55 kg/m    Physical Exam Constitutional:      General: She is not in acute distress.    Appearance: Normal appearance. She is not ill-appearing.  HENT:     Right Ear: Tympanic membrane, ear canal and external ear normal. There is no impacted cerumen.     Left Ear: Tympanic membrane, ear canal and external ear normal.  There is no impacted cerumen.     Nose: Nose normal.     Mouth/Throat:     Mouth: Mucous membranes are moist.     Pharynx: Oropharynx is clear. No oropharyngeal exudate or posterior oropharyngeal erythema.  Eyes:     Extraocular Movements: Extraocular movements intact.     Pupils: Pupils are equal, round, and reactive to light.  Musculoskeletal:     Cervical back: Normal range of motion and neck supple.  Neurological:     General: No focal deficit present.     Mental Status: She is alert and oriented to person, place, and time. Mental status is at baseline.  Psychiatric:        Mood and Affect: Mood normal.        Behavior: Behavior normal.        Thought Content: Thought content normal.        Judgment: Judgment normal.     No results found for any visits on 04/11/23.      Assessment & Plan:   Problem List Items Addressed This Visit     Hyperlipidemia (Chronic)   Relevant Orders   Comprehensive metabolic panel   Lipid panel   Other Visit Diagnoses     Tinnitus of both ears    -  Primary Tinnitus: Chronic bilateral tinnitus, worsening over the past year. No associated hearing loss, dizziness, or imbalance. No recent  changes in medication. Ears appear normal on examination. -Refer to Ear, Nose, and Throat (ENT) specialist for further evaluation. -Start Flonase nasal spray to reduce potential inflammation and improve drainage    Relevant Orders   Ambulatory referral to ENT   CBC with Differential/Platelet   Low vitamin D level       Relevant Orders   VITAMIN D 25 Hydroxy (Vit-D Deficiency, Fractures)         No orders of the defined types were placed in this encounter.   Return if symptoms worsen or fail to improve.  Clayborne Dana, NP

## 2023-04-12 NOTE — Addendum Note (Signed)
Addended by: Hyman Hopes B on: 04/12/2023 01:01 PM   Modules accepted: Orders
